# Patient Record
Sex: Female | Born: 1990 | Race: White | Hispanic: No | Marital: Married | Smoking: Former smoker
Health system: Southern US, Community
[De-identification: ages and names within clinical notes are randomized; demographics above are authoritative.]

## PROBLEM LIST (undated history)

## (undated) DIAGNOSIS — D649 Anemia, unspecified: Secondary | ICD-10-CM

## (undated) DIAGNOSIS — O26893 Other specified pregnancy related conditions, third trimester: Secondary | ICD-10-CM

## (undated) DIAGNOSIS — E079 Disorder of thyroid, unspecified: Secondary | ICD-10-CM

## (undated) DIAGNOSIS — Z6791 Unspecified blood type, Rh negative: Secondary | ICD-10-CM

## (undated) DIAGNOSIS — G8929 Other chronic pain: Secondary | ICD-10-CM

## (undated) DIAGNOSIS — O039 Complete or unspecified spontaneous abortion without complication: Secondary | ICD-10-CM

## (undated) DIAGNOSIS — R51 Headache: Secondary | ICD-10-CM

## (undated) HISTORY — DX: Headache: R51

## (undated) HISTORY — DX: Other chronic pain: G89.29

---
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
## 2011-10-27 ENCOUNTER — Ambulatory Visit (INDEPENDENT_AMBULATORY_CARE_PROVIDER_SITE_OTHER): Payer: BC Managed Care – PPO | Admitting: Women's Health

## 2011-10-27 ENCOUNTER — Encounter: Payer: Self-pay | Admitting: Women's Health

## 2011-10-27 VITALS — BP 120/70 | Ht 64.5 in | Wt 151.0 lb

## 2011-10-27 DIAGNOSIS — Z309 Encounter for contraceptive management, unspecified: Secondary | ICD-10-CM

## 2011-10-27 DIAGNOSIS — Z23 Encounter for immunization: Secondary | ICD-10-CM

## 2011-10-27 DIAGNOSIS — Z01419 Encounter for gynecological examination (general) (routine) without abnormal findings: Secondary | ICD-10-CM

## 2011-10-27 DIAGNOSIS — N926 Irregular menstruation, unspecified: Secondary | ICD-10-CM

## 2011-10-27 DIAGNOSIS — IMO0001 Reserved for inherently not codable concepts without codable children: Secondary | ICD-10-CM

## 2011-10-27 LAB — CBC WITH DIFFERENTIAL/PLATELET
Basophils Absolute: 0 10*3/uL (ref 0.0–0.1)
HCT: 38.5 % (ref 36.0–46.0)
Hemoglobin: 12.2 g/dL (ref 12.0–15.0)
Lymphocytes Relative: 35 % (ref 12–46)
Monocytes Absolute: 0.6 10*3/uL (ref 0.1–1.0)
Monocytes Relative: 9 % (ref 3–12)
Neutro Abs: 3.8 10*3/uL (ref 1.7–7.7)
RBC: 4.45 MIL/uL (ref 3.87–5.11)
RDW: 12.9 % (ref 11.5–15.5)
WBC: 6.8 10*3/uL (ref 4.0–10.5)

## 2011-10-27 LAB — PROLACTIN: Prolactin: 5.2 ng/mL

## 2011-10-27 LAB — URINALYSIS W MICROSCOPIC + REFLEX CULTURE
Bilirubin Urine: NEGATIVE
Specific Gravity, Urine: 1.015 (ref 1.005–1.030)
Urobilinogen, UA: 0.2 mg/dL (ref 0.0–1.0)

## 2011-10-27 LAB — TSH: TSH: 1.313 u[IU]/mL (ref 0.350–4.500)

## 2011-10-27 MED ORDER — NORGESTIMATE-ETH ESTRADIOL 0.25-35 MG-MCG PO TABS: 1.0000 | ORAL_TABLET | Freq: Every day | ORAL | Status: DC

## 2011-10-27 NOTE — Patient Instructions (Signed)
Health Maintenance, 18- to 21-Year-Old SCHOOL PERFORMANCE After high school completion, the Nancy Grimes adult may be attending college, technical or vocational school, or entering the military or the work force. SOCIAL AND EMOTIONAL DEVELOPMENT The Nancy Grimes adult establishes adult relationships and explores sexual identity. Nancy Grimes adults may be living at home or in a college dorm or apartment. Increasing independence is important with Nancy Grimes adults. Throughout adolescence, teens should assume responsibility of their own health care. IMMUNIZATIONS Most Nancy Grimes adults should be fully vaccinated. A booster dose of Tdap (tetanus, diphtheria, and pertussis, or "whooping cough"), a dose of meningococcal vaccine to protect against a certain type of bacterial meningitis, hepatitis A, human papillomarvirus (HPV), chickenpox, or measles vaccines may be indicated, if not given at an earlier age. Annual influenza or "flu" vaccination should be considered during flu season.  TESTING Annual screening for vision and hearing problems is recommended. Vision should be screened objectively at least once between 18 and 21 years of age. The Nancy Grimes adult may be screened for anemia or tuberculosis. Nancy Grimes adults should have a blood test to check for high cholesterol during this time period. Nancy Grimes adults should be screened for use of alcohol and drugs. If the Nancy Grimes adult is sexually active, screening for sexually transmitted infections, pregnancy, or HIV may be performed. Screening for cervical cancer should be performed within 3 years of beginning sexual activity. NUTRITION AND ORAL HEALTH  Adequate calcium intake is important. Consume 3 servings of low-fat milk and dairy products daily. For those who do not drink milk or consume dairy products, calcium enriched foods, such as juice, bread, or cereal, dark, leafy greens, or canned fish are alternate sources of calcium.   Drink plenty of water. Limit fruit juice to 8 to 12 ounces per day.  Avoid sugary beverages or sodas.   Discourage skipping meals, especially breakfast. Teens should eat a good variety of vegetables and fruits, as well as lean meats.   Avoid high fat, high salt, and high sugar foods, such as candy, chips, and cookies.   Encourage Nancy Grimes adults to participate in meal planning and preparation.   Eat meals together as a family whenever possible. Encourage conversation at mealtime.   Limit fast food choices and eating out at restaurants.   Brush teeth twice a day and floss.   Schedule dental exams twice a year.  SLEEP Regular sleep habits are important. PHYSICAL, SOCIAL, AND EMOTIONAL DEVELOPMENT  One hour of regular physical activity daily is recommended. Continue to participate in sports.   Encourage Nancy Grimes adults to develop their own interests and consider community service or volunteerism.   Provide guidance to the Nancy Grimes adult in making decisions about college and work plans.   Make sure that Nancy Grimes adults know that they should never be in a situation that makes them uncomfortable, and they should tell partners if they do not want to engage in sexual activity.   Talk to the Nancy Grimes adult about body image. Eating disorders may be noted at this time. Nancy Grimes adults may also be concerned about being overweight. Monitor the Nancy Grimes adult for weight gain or loss.   Mood disturbances, depression, anxiety, alcoholism, or attention problems may be noted in Nancy Grimes adults. Talk to the caregiver if there are concerns about mental illness.   Negotiate limit setting and independent decision making.   Encourage the Nancy Grimes adult to handle conflict without physical violence.   Avoid loud noises which may impair hearing.   Limit television and computer time to 2 hours per day.   Individuals who engage in excessive sedentary activity are more likely to become overweight.  RISK BEHAVIORS  Sexually active Nancy Grimes adults need to take precautions against pregnancy and sexually  transmitted infections. Talk to Nancy Grimes adults about contraception.   Provide a tobacco-free and drug-free environment for the Nancy Grimes adult. Talk to the Nancy Grimes adult about drug, tobacco, and alcohol use among friends or at friends' homes. Make sure the Nancy Grimes adult knows that smoking tobacco or marijuana and taking drugs have health consequences and may impact brain development.   Teach the Nancy Grimes adult about appropriate use of over-the-counter or prescription medicines.   Establish guidelines for driving and for Grimes with friends.   Talk to Nancy Grimes adults about the risks of drinking and driving or boating. Encourage the Nancy Grimes adult to call you if he or she or friends have been drinking or using drugs.   Remind Nancy Grimes adults to wear seat belts at all times in cars and life vests in boats.   Nancy Grimes adults should always wear a properly fitted helmet when they are Grimes a bicycle.   Use caution with all-terrain vehicles (ATVs) or other motorized vehicles.   Do not keep handguns in the home. (If you do, the gun and ammunition should be locked separately and out of the Nancy Grimes adult's access.)   Equip your home with smoke detectors and change the batteries regularly. Make sure all family members know the fire escape plans for your home.   Teach Nancy Grimes adults not to swim alone and not to dive in shallow water.   All individuals should wear sunscreen that protects against UVA and UVB light with at least a sun protection factor (SPF) of 30 when out in the sun. This minimizes sun burning.  WHAT'S NEXT? Nancy Grimes adults should visit their pediatrician or family physician yearly. By Nancy Grimes adulthood, health care should be transitioned to a family physician or internal medicine specialist. Sexually active females may want to begin annual physical exams with a gynecologist. Document Released: 10/23/2006 Document Revised: 07/17/2011 Document Reviewed: 11/12/2006 ExitCare Patient Information 2012 [employer]. 

## 2011-10-27 NOTE — Progress Notes (Signed)
Nancy Grimes 1991-01-03 [id number]    History:    The patient presents for annual exam.  Had SAB 08/29/11 at [facility]. States bled for approximately 10 days after SAB. Had several days of spotting, normal cycle in February for 7 days, currently on cycle now. Uses condoms for contraception. Had 2 gardasil's in the series approximately 7 months ago. States had positive Chlamydia that was treated in January, partner treated also. Partner presents today also had HIV, hepatitis and an RPR that were negative also. Requesting contraception that will help cycles be lighter with less cramps.   Past medical history, past surgical history, family history and social history were all reviewed and documented in the EPIC chart. Student at [employer].   ROS:  A  ROS was performed and pertinent positives and negatives are included in the history.  Exam:  Filed Vitals:   10/27/11 1119  BP: 120/70    General appearance:  Normal Head/Neck:  Normal, without cervical or supraclavicular adenopathy. Thyroid:  Symmetrical, normal in size, without palpable masses or nodularity. Respiratory  Effort:  Normal  Auscultation:  Clear without wheezing or rhonchi Cardiovascular  Auscultation:  Regular rate, without rubs, murmurs or gallops  Edema/varicosities:  Not grossly evident Abdominal  Soft,nontender, without masses, guarding or rebound.  Liver/spleen:  No organomegaly noted  Hernia:  None appreciated  Skin  Inspection:  Grossly normal  Palpation:  Grossly normal Neurologic/psychiatric  Orientation:  Normal with appropriate conversation.  Mood/affect:  Normal  Genitourinary    Breasts: Examined lying and sitting.     Right: Without masses, retractions, discharge or axillary adenopathy.     Left: Without masses, retractions, discharge or axillary adenopathy.   Inguinal/mons:  Normal without inguinal adenopathy  External genitalia:  Normal  BUS/Urethra/Skene's glands:   Normal  Bladder:  Normal  Vagina:  Normal  Cervix:  Normal/scant menses, GC/chlamydia culture taken  Uterus:   normal in size, shape and contour.  Midline and mobile/nontender  Adnexa/parametria:     Rt: Without masses or tenderness.   Lt: Without masses or tenderness.  Anus and perineum: Normal  Digital rectal exam:   Assessment/Plan:  21 y.o. SWF for annual exam for contraception management.  Contraception management STD screening/TOC Smoker, less than 5 cigarettes per day  Plan: Contraception options reviewed will try birth control pills. Will start today, prescription for Sprintec one by mouth daily, slight risk for blood clots and strokes reviewed. UPT negative. Reviewed importance of condoms first month and for infection control. Reviewed importance of no smoking. SBE's, exercise, calcium rich diet, MVI daily encouraged. Campus safety reviewed. Third gardasil given to complete the series. CBC, UA, GC/Chlamydia. Declines need for repeat HIV, hepatitis or RPR.     Harrington Challenger [facility], 1:31 PM 10/27/2011

## 2011-10-28 LAB — GC/CHLAMYDIA PROBE AMP, GENITAL: GC Probe Amp, Genital: NEGATIVE

## 2011-11-27 ENCOUNTER — Other Ambulatory Visit: Payer: Self-pay | Admitting: *Deleted

## 2011-11-27 DIAGNOSIS — IMO0001 Reserved for inherently not codable concepts without codable children: Secondary | ICD-10-CM

## 2011-11-27 MED ORDER — NORGESTIMATE-ETH ESTRADIOL 0.25-35 MG-MCG PO TABS: 1.0000 | ORAL_TABLET | Freq: Every day | ORAL | Status: DC

## 2011-11-27 NOTE — Telephone Encounter (Signed)
Pt called requesting refill for sprintec, rx sent to pharmacy.

## 2012-12-26 ENCOUNTER — Inpatient Hospital Stay: Payer: Self-pay | Admitting: Obstetrics and Gynecology

## 2012-12-26 LAB — CBC WITH DIFFERENTIAL/PLATELET
Basophil #: 0 10*3/uL (ref 0.0–0.1)
HCT: 32.2 % — ABNORMAL LOW (ref 35.0–47.0)
HGB: 10.5 g/dL — ABNORMAL LOW (ref 12.0–16.0)
Lymphocyte %: 6.6 %
MCH: 26.8 pg (ref 26.0–34.0)
MCHC: 32.6 g/dL (ref 32.0–36.0)
MCV: 82 fL (ref 80–100)
Platelet: 280 10*3/uL (ref 150–440)

## 2012-12-26 LAB — GC/CHLAMYDIA PROBE AMP

## 2012-12-28 LAB — HEMATOCRIT: HCT: 24.8 % — ABNORMAL LOW (ref 35.0–47.0)

## 2014-06-12 ENCOUNTER — Encounter: Payer: Self-pay | Admitting: Women's Health

## 2014-08-11 NOTE — L&D Delivery Note (Signed)
Delivery Note At  a viable female was delivered via  (Presentation loa: ;  ).Double loose nuchal reduced   APGAR:8/9 , ; weight  .   Placenta status:intact  , .  with the following complications:none  .    Anesthesia: Epidural  Episiotomy:  none Lacerations:  none Suture Repair: none Est. Blood Loss (mL):  200 cc  Mom to postpartum.  Baby to Couplet care / Skin to Skin.  SCHERMERHORN,THOMAS 05/08/2015, 4:50 PM

## 2014-12-19 NOTE — H&P (Signed)
L&D Evaluation:  History Expanded:  HPI 24 yo G2P0010 at 588w2d gestational age by LMP consistent with 22 week ultrasound presents with regular uterine contractions since 1130pm last night.  Her pregnancy complicated by late entry to care (23 weeks), Rh negative status, LSIL pap (<23 yo), She notes positive fetal movement, denies leakage of fluid and vaginal bleeding.   HBsAg neg / falied 1 hour gtt (3hour all wnl)   Blood Type (Maternal) A negative   Group B Strep Results Maternal (Result >5wks must be treated as unknown) positive   Maternal HIV Negative   Maternal Syphilis Ab Nonreactive   Maternal Varicella Immune   Rubella Results (Maternal) immune   [facility]   Patient's Medical History No Chronic Illness   Patient's Surgical History Arm surgery   Medications Pre Natal Vitamins   Allergies PCN   Social History none   Family History Non-Contributory   ROS:  ROS All systems were reviewed.  HEENT, CNS, GI, GU, Respiratory, CV, Renal and Musculoskeletal systems were found to be normal.   Exam:  Vital Signs stable   General no apparent distress, mild distress with contractions   Mental Status clear   Chest clear   Heart normal sinus rhythm   Abdomen gravid, tender with contractions   Estimated Fetal Weight Average for gestational age   Back no CVAT   Edema no edema   Pelvic no external lesions, 4/100/-1 (bulging bag)   Mebranes Intact   FHT Description 135/mod var/+accels/no decels   Ucx regular, 4-5 q 10 min avgd over 30 min   Skin no lesions   Impression:  Impression active labor   Plan:  Plan EFM/NST, monitor contractions and for cervical change, antibiotics for GBBS prophylaxis, fluids   Comments Admit for labor. Patient has changed from 2cm to 4cm since arriving this morning.  She has been in considerable pain since arriving with regular contractions GBS +: Ancef  Fetal well being: Cat 1 desires epidural: may receive when OK from  anesthesia standpoint   Electronic Signatures: Conard NovakJackson, Dent Plantz D (MD)  (Signed 18-May-14 12:37)  Authored: L&D Evaluation   Last Updated: 18-May-14 12:37 by Conard NovakJackson, Zorianna Taliaferro D (MD)

## 2015-05-03 LAB — OB RESULTS CONSOLE ABO/RH: RH TYPE: NEGATIVE

## 2015-05-03 LAB — OB RESULTS CONSOLE RUBELLA ANTIBODY, IGM: Rubella: NON-IMMUNE/NOT IMMUNE

## 2015-05-03 LAB — OB RESULTS CONSOLE GC/CHLAMYDIA
Chlamydia: NEGATIVE
Gonorrhea: NEGATIVE

## 2015-05-03 LAB — OB RESULTS CONSOLE RPR: RPR: NONREACTIVE

## 2015-05-03 LAB — OB RESULTS CONSOLE HIV ANTIBODY (ROUTINE TESTING): HIV: NONREACTIVE

## 2015-05-03 LAB — OB RESULTS CONSOLE HEPATITIS B SURFACE ANTIGEN: HEP B S AG: NEGATIVE

## 2015-05-03 LAB — OB RESULTS CONSOLE VARICELLA ZOSTER ANTIBODY, IGG: Varicella: IMMUNE

## 2015-05-03 LAB — OB RESULTS CONSOLE GBS: STREP GROUP B AG: POSITIVE

## 2015-05-03 LAB — OB RESULTS CONSOLE ANTIBODY SCREEN: ANTIBODY SCREEN: NEGATIVE

## 2015-05-07 DIAGNOSIS — Z6791 Unspecified blood type, Rh negative: Secondary | ICD-10-CM | POA: Insufficient documentation

## 2015-05-07 DIAGNOSIS — O9989 Other specified diseases and conditions complicating pregnancy, childbirth and the puerperium: Secondary | ICD-10-CM

## 2015-05-07 DIAGNOSIS — Z283 Underimmunization status: Secondary | ICD-10-CM | POA: Insufficient documentation

## 2015-05-07 DIAGNOSIS — O09899 Supervision of other high risk pregnancies, unspecified trimester: Secondary | ICD-10-CM | POA: Insufficient documentation

## 2015-05-07 DIAGNOSIS — O26893 Other specified pregnancy related conditions, third trimester: Secondary | ICD-10-CM

## 2015-05-07 DIAGNOSIS — B951 Streptococcus, group B, as the cause of diseases classified elsewhere: Secondary | ICD-10-CM | POA: Insufficient documentation

## 2015-05-08 ENCOUNTER — Inpatient Hospital Stay
Admission: EM | Admit: 2015-05-08 | Discharge: 2015-05-10 | DRG: 775 | Disposition: A | Discharge status: Home / Self Care | Payer: Commercial Managed Care - HMO | Attending: Obstetrics and Gynecology | Admitting: Obstetrics and Gynecology

## 2015-05-08 ENCOUNTER — Inpatient Hospital Stay: Payer: Commercial Managed Care - HMO | Admitting: Registered Nurse

## 2015-05-08 DIAGNOSIS — Z87891 Personal history of nicotine dependence: Secondary | ICD-10-CM | POA: Diagnosis not present

## 2015-05-08 DIAGNOSIS — Z880 Allergy status to penicillin: Secondary | ICD-10-CM

## 2015-05-08 DIAGNOSIS — Z22330 Carrier of Group B streptococcus: Secondary | ICD-10-CM | POA: Diagnosis not present

## 2015-05-08 DIAGNOSIS — Z3A39 39 weeks gestation of pregnancy: Secondary | ICD-10-CM | POA: Diagnosis present

## 2015-05-08 DIAGNOSIS — O26893 Other specified pregnancy related conditions, third trimester: Secondary | ICD-10-CM | POA: Diagnosis present

## 2015-05-08 DIAGNOSIS — O99824 Streptococcus B carrier state complicating childbirth: Principal | ICD-10-CM | POA: Diagnosis present

## 2015-05-08 LAB — CBC
HEMATOCRIT: 29.6 % — AB (ref 35.0–47.0)
HEMOGLOBIN: 10.2 g/dL — AB (ref 12.0–16.0)
MCH: 27.7 pg (ref 26.0–34.0)
MCHC: 34.3 g/dL (ref 32.0–36.0)
MCV: 80.9 fL (ref 80.0–100.0)
Platelets: 257 10*3/uL (ref 150–440)
RBC: 3.66 MIL/uL — AB (ref 3.80–5.20)
RDW: 16.3 % — ABNORMAL HIGH (ref 11.5–14.5)
WBC: 10.5 10*3/uL (ref 3.6–11.0)

## 2015-05-08 LAB — TYPE AND SCREEN
ABO/RH(D): A NEG
ANTIBODY SCREEN: POSITIVE

## 2015-05-08 MED ORDER — ONDANSETRON HCL 4 MG/2ML IJ SOLN: 4.0000 mg | Freq: Four times a day (QID) | INTRAMUSCULAR | Status: DC | PRN

## 2015-05-08 MED ORDER — BENZOCAINE-MENTHOL 20-0.5 % EX AERO
1.0000 "application " | INHALATION_SPRAY | CUTANEOUS | Status: DC | PRN
  Administered 2015-05-10: 1 via TOPICAL

## 2015-05-08 MED ORDER — BUTORPHANOL TARTRATE 1 MG/ML IJ SOLN: 1.0000 mg | INTRAMUSCULAR | Status: DC | PRN

## 2015-05-08 MED ORDER — TERBUTALINE SULFATE 1 MG/ML IJ SOLN: 0.2500 mg | Freq: Once | INTRAMUSCULAR | Status: DC | PRN

## 2015-05-08 MED ORDER — LACTATED RINGERS IV SOLN
INTRAVENOUS | Status: DC
  Administered 2015-05-08: 07:00:00 via INTRAVENOUS

## 2015-05-08 MED ORDER — SENNOSIDES-DOCUSATE SODIUM 8.6-50 MG PO TABS
2.0000 | ORAL_TABLET | ORAL | Status: DC
  Administered 2015-05-09: 2 via ORAL
  Filled 2015-05-08: qty 2

## 2015-05-08 MED ORDER — DIBUCAINE 1 % RE OINT: 1.0000 "application " | TOPICAL_OINTMENT | RECTAL | Status: DC | PRN

## 2015-05-08 MED ORDER — CEFAZOLIN SODIUM-DEXTROSE 2-3 GM-% IV SOLR
2.0000 g | Freq: Once | INTRAVENOUS | Status: AC
  Administered 2015-05-08: 2 g via INTRAVENOUS

## 2015-05-08 MED ORDER — OXYTOCIN 40 UNITS IN LACTATED RINGERS INFUSION - SIMPLE MED
62.5000 mL/h | INTRAVENOUS | Status: DC
  Filled 2015-05-08: qty 1000

## 2015-05-08 MED ORDER — OXYTOCIN 40 UNITS IN LACTATED RINGERS INFUSION - SIMPLE MED
1.0000 m[IU]/min | INTRAVENOUS | Status: DC
  Administered 2015-05-08: 1 m[IU]/min via INTRAVENOUS

## 2015-05-08 MED ORDER — MISOPROSTOL 200 MCG PO TABS
ORAL_TABLET | ORAL | Status: DC
Start: 2015-05-08 — End: 2015-05-08
  Filled 2015-05-08: qty 4

## 2015-05-08 MED ORDER — LIDOCAINE HCL (PF) 1 % IJ SOLN
INTRAMUSCULAR | Status: AC
  Filled 2015-05-08: qty 30

## 2015-05-08 MED ORDER — OXYCODONE-ACETAMINOPHEN 5-325 MG PO TABS: 1.0000 | ORAL_TABLET | ORAL | Status: DC | PRN

## 2015-05-08 MED ORDER — MAGNESIUM HYDROXIDE 400 MG/5ML PO SUSP: 30.0000 mL | ORAL | Status: DC | PRN

## 2015-05-08 MED ORDER — FERROUS SULFATE 325 (65 FE) MG PO TABS
325.0000 mg | ORAL_TABLET | Freq: Two times a day (BID) | ORAL | Status: DC
  Administered 2015-05-09 – 2015-05-10 (×2): 325 mg via ORAL
  Filled 2015-05-08 (×2): qty 1

## 2015-05-08 MED ORDER — CEFAZOLIN SODIUM 1-5 GM-% IV SOLN
INTRAVENOUS | Status: AC
  Administered 2015-05-08: 1000 mg
  Filled 2015-05-08: qty 50

## 2015-05-08 MED ORDER — IBUPROFEN 600 MG PO TABS
600.0000 mg | ORAL_TABLET | Freq: Four times a day (QID) | ORAL | Status: DC
  Administered 2015-05-09 (×3): 600 mg via ORAL
  Filled 2015-05-08 (×3): qty 1

## 2015-05-08 MED ORDER — ACETAMINOPHEN 325 MG PO TABS: 650.0000 mg | ORAL_TABLET | ORAL | Status: DC | PRN

## 2015-05-08 MED ORDER — ONDANSETRON HCL 4 MG/2ML IJ SOLN: 4.0000 mg | INTRAMUSCULAR | Status: DC | PRN

## 2015-05-08 MED ORDER — ONDANSETRON HCL 4 MG PO TABS
4.0000 mg | ORAL_TABLET | ORAL | Status: DC | PRN
Start: 2015-05-08 — End: 2015-05-10

## 2015-05-08 MED ORDER — LIDOCAINE-EPINEPHRINE (PF) 1.5 %-1:200000 IJ SOLN
INTRAMUSCULAR | Status: DC | PRN
  Administered 2015-05-08: 3 mL via PERINEURAL

## 2015-05-08 MED ORDER — WITCH HAZEL-GLYCERIN EX PADS: 1.0000 "application " | MEDICATED_PAD | CUTANEOUS | Status: DC | PRN

## 2015-05-08 MED ORDER — AMMONIA AROMATIC IN INHA
RESPIRATORY_TRACT | Status: AC
  Filled 2015-05-08: qty 10

## 2015-05-08 MED ORDER — LACTATED RINGERS IV SOLN
500.0000 mL | INTRAVENOUS | Status: DC | PRN
  Administered 2015-05-08: 500 mL via INTRAVENOUS

## 2015-05-08 MED ORDER — BUPIVACAINE HCL (PF) 0.25 % IJ SOLN
INTRAMUSCULAR | Status: DC | PRN
  Administered 2015-05-08 (×2): 4 mL via EPIDURAL

## 2015-05-08 MED ORDER — PRENATAL MULTIVITAMIN CH
1.0000 | ORAL_TABLET | Freq: Every day | ORAL | Status: DC
  Administered 2015-05-09: 09:00:00 via ORAL
  Administered 2015-05-10: 1 via ORAL
  Filled 2015-05-08 (×2): qty 1

## 2015-05-08 MED ORDER — LANOLIN HYDROUS EX OINT: TOPICAL_OINTMENT | CUTANEOUS | Status: DC | PRN

## 2015-05-08 MED ORDER — OXYCODONE-ACETAMINOPHEN 5-325 MG PO TABS
2.0000 | ORAL_TABLET | ORAL | Status: DC | PRN
Start: 2015-05-08 — End: 2015-05-08

## 2015-05-08 MED ORDER — OXYTOCIN 40 UNITS IN LACTATED RINGERS INFUSION - SIMPLE MED
62.5000 mL/h | INTRAVENOUS | Status: DC | PRN
  Administered 2015-05-09: 62.5 mL/h via INTRAVENOUS
  Filled 2015-05-08: qty 1000

## 2015-05-08 MED ORDER — SIMETHICONE 80 MG PO CHEW: 80.0000 mg | CHEWABLE_TABLET | ORAL | Status: DC | PRN

## 2015-05-08 MED ORDER — CEFAZOLIN SODIUM-DEXTROSE 2-3 GM-% IV SOLR
INTRAVENOUS | Status: AC
  Administered 2015-05-08: 2 g via INTRAVENOUS
  Filled 2015-05-08: qty 50

## 2015-05-08 MED ORDER — FENTANYL 2.5 MCG/ML W/ROPIVACAINE 0.2% IN NS 100 ML EPIDURAL INFUSION (ARMC-ANES)
EPIDURAL | Status: AC
  Administered 2015-05-08: 9 mL/h via EPIDURAL
  Filled 2015-05-08: qty 100

## 2015-05-08 MED ORDER — OXYTOCIN BOLUS FROM INFUSION
500.0000 mL | INTRAVENOUS | Status: DC
  Administered 2015-05-08: 500 mL via INTRAVENOUS

## 2015-05-08 MED ORDER — FENTANYL CITRATE (PF) 100 MCG/2ML IJ SOLN
INTRAMUSCULAR | Status: DC | PRN
  Administered 2015-05-08: 9 ug via INTRAVENOUS

## 2015-05-08 MED ORDER — ZOLPIDEM TARTRATE 5 MG PO TABS: 5.0000 mg | ORAL_TABLET | Freq: Every evening | ORAL | Status: DC | PRN

## 2015-05-08 MED ORDER — LIDOCAINE HCL (PF) 1 % IJ SOLN
30.0000 mL | INTRAMUSCULAR | Status: AC | PRN
  Administered 2015-05-08: 3 mL via SUBCUTANEOUS

## 2015-05-08 MED ORDER — MEASLES, MUMPS & RUBELLA VAC ~~LOC~~ INJ
0.5000 mL | INJECTION | Freq: Once | SUBCUTANEOUS | Status: AC
  Administered 2015-05-10: 0.5 mL via SUBCUTANEOUS
  Filled 2015-05-08: qty 0.5

## 2015-05-08 MED ORDER — CITRIC ACID-SODIUM CITRATE 334-500 MG/5ML PO SOLN: 30.0000 mL | ORAL | Status: DC | PRN

## 2015-05-08 MED ORDER — DIPHENHYDRAMINE HCL 25 MG PO CAPS: 25.0000 mg | ORAL_CAPSULE | Freq: Four times a day (QID) | ORAL | Status: DC | PRN

## 2015-05-08 NOTE — Progress Notes (Signed)
Dr Feliberto Gottron present in department. MD notified that pt passed out while on toilet after voiding. MD aware of interventions and fluid bolus. MD also aware of VS. MD ok with plan. No new orders received.

## 2015-05-08 NOTE — Progress Notes (Signed)
Pt transferred to room 343 via wheelchair in stable condition. Baby transferred with mother. SBAR report given to Carmon Ginsberg, RN

## 2015-05-08 NOTE — Anesthesia Procedure Notes (Addendum)
Epidural Patient location during procedure: OB Start time: 05/08/2015 10:48 AM End time: 05/08/2015 10:51 AM  Staffing Resident/CRNA: Stormy Fabian Performed by: resident/CRNA   Preanesthetic Checklist Completed: patient identified, site marked, surgical consent, pre-op evaluation, timeout performed, IV checked, risks and benefits discussed and monitors and equipment checked  Epidural Patient position: sitting Prep: Betadine Patient monitoring: heart rate, continuous pulse ox and blood pressure Approach: midline Location: L4-L5 Injection technique: LOR saline  Needle:  Needle type: Tuohy  Needle gauge: 18 G Needle length: 9 cm and 9 Needle insertion depth: 6 cm Catheter type: closed end flexible Catheter size: 20 Guage Catheter at skin depth: 11 cm Test dose: negative and 1.5% lidocaine with Epi 1:200 K  Assessment Events: blood not aspirated, injection not painful, no injection resistance, negative IV test and no paresthesia  Additional Notes   Patient tolerated the insertion well without complications.Reason for block:procedure for pain

## 2015-05-08 NOTE — H&P (Signed)
24 yo G2 P1001 [facility] with SROM at 0300 for clear fluid. Pt had 2 visits of PNC prior to arrival. Pt did not know last week that she was pregnant and came to see me for amenorrhea and bloating and was evaluated and found to be pregnant. Pt was shocked as she had taken 3 pregnancy tests and they were all neg. Pt was seen yest and had +GBS which was Clindamycin resistant. US done last week indicated pt to be 38 5/7 weeks and now 39 2/7 weeks with EDD of 05/13/2015.  History reviewed. No pertinent past medical history. Past Surgical History  Procedure Laterality Date  . Arm surgery  2007    RIGHT ELBOW   History reviewed. No pertinent family history.  Social History   Social History  . Marital Status: Single    Spouse Name: N/A  . Number of Children: N/A  . Years of Education: N/A   Occupational History  . Not on file.   Social History Main Topics  . Smoking status: Former Games developer  . Smokeless tobacco: Never Used  . Alcohol Use: No  . Drug Use: No  . Sexual Activity: Not Currently    Birth Control/ Protection: Condom   Other Topics Concern  . Not on file   Social History Narrative   Allergies  Allergen Reactions  . Other     SKIN ALLERGY-ACNE RX  . Penicillins Swelling  Gen: 24 yo white female in NAD. Heart: S1S2, RRR, no M/R/G. Lungs: CTA bilat, No W/R/R. Abd: Gravid, EFW 7#9oz. Cx: 4/80/vtx-2 Extrems: 1+/0 A: iup at term 2. GBS pos 3. PCN allergy with Clindamycin sensitivity P: Admit for delivery 2. Ancef  2 gms IV x 1 and then 1 gm IV q 8 hours 3. Continue to monitor UC/FHT's 4. Stadol 1-2 mg IV q 1-2 h prn pain

## 2015-05-08 NOTE — Progress Notes (Signed)
Patient ID: Nancy Grimes, female   DOB: [birth year], 24 y.o.   MRN: [id number] H+P reviewed . Assuming care . Prior variable decels ? Late . Current FHR reassuring . FSE+IUPC placed . Ancef dose at 0600 for GBS prophylaxis  If not adequate CTX pattern at noon will start pitocin augmentation

## 2015-05-08 NOTE — Anesthesia Preprocedure Evaluation (Addendum)
Anesthesia Evaluation  Patient identified by MRN, date of birth, ID band Patient awake    Reviewed: Allergy & Precautions, H&P , NPO status , Patient's Chart, lab work & pertinent test results  Airway Mallampati: II  TM Distance: <3 FB Neck ROM: full    Dental no notable dental hx.    Pulmonary neg pulmonary ROS, former smoker,    Pulmonary exam normal        Cardiovascular negative cardio ROS Normal cardiovascular exam     Neuro/Psych    GI/Hepatic negative GI ROS, Neg liver ROS,   Endo/Other  negative endocrine ROS  Renal/GU negative Renal ROS     Musculoskeletal   Abdominal   Peds  Hematology negative hematology ROS (+)   Anesthesia Other Findings   Reproductive/Obstetrics (+) Pregnancy                            Anesthesia Physical Anesthesia Plan  ASA: II  Anesthesia Plan: Epidural   Post-op Pain Management:    Induction:   Airway Management Planned:   Additional Equipment:   Intra-op Plan:   Post-operative Plan:   Informed Consent: I have reviewed the patients History and Physical, chart, labs and discussed the procedure including the risks, benefits and alternatives for the proposed anesthesia with the patient or authorized representative who has indicated his/her understanding and acceptance.     Plan Discussed with: Anesthesiologist  Anesthesia Plan Comments:         Anesthesia Quick Evaluation

## 2015-05-08 NOTE — Progress Notes (Signed)
Assisted pt to bathroom in stable condition. Pt voided. While doing pericare pt became dizzy and ultimately passed out on the toilet. Extra help called immediately. Ammonia used. Assisted pt back to bed on left sided trendelenberg. LR fluid bolus started. VS obtained.

## 2015-05-09 LAB — CBC
HCT: 26.9 % — ABNORMAL LOW (ref 35.0–47.0)
HEMOGLOBIN: 8.9 g/dL — AB (ref 12.0–16.0)
MCH: 27.2 pg (ref 26.0–34.0)
MCHC: 33.3 g/dL (ref 32.0–36.0)
MCV: 81.7 fL (ref 80.0–100.0)
PLATELETS: 231 10*3/uL (ref 150–440)
RBC: 3.29 MIL/uL — AB (ref 3.80–5.20)
RDW: 16.3 % — ABNORMAL HIGH (ref 11.5–14.5)
WBC: 13.2 10*3/uL — ABNORMAL HIGH (ref 3.6–11.0)

## 2015-05-09 LAB — RPR: RPR: NONREACTIVE

## 2015-05-09 MED ORDER — IBUPROFEN 600 MG PO TABS
600.0000 mg | ORAL_TABLET | Freq: Four times a day (QID) | ORAL | Status: DC | PRN
  Administered 2015-05-10: 600 mg via ORAL
  Filled 2015-05-09: qty 1

## 2015-05-09 NOTE — Progress Notes (Signed)
Post Partum Day 1 Subjective: no complaints  Objective: Blood pressure 100/55, pulse 77, temperature 97.6 F (36.4 C), temperature source Oral, resp. rate 18, height  (1.651 m), weight 86.183 kg (190 lb), SpO2 10 %, unknown if currently breastfeeding.  Physical Exam:  General: alert and cooperative Lochia: appropriate Uterine Fundus: firm Incision: n/a DVT Evaluation: No evidence of DVT seen on physical exam.   Recent Labs  05/08/15 0824 05/09/15 0612  HGB 10.2* 8.9*  HCT 29.6* 26.9*    Assessment/Plan: Plan for discharge tomorrow   LOS: 1 day   SCHERMERHORN,THOMAS 05/09/2015, 8:41 AM

## 2015-05-09 NOTE — Anesthesia Postprocedure Evaluation (Deleted)
  Anesthesia Post-op Note  Patient: Nancy Grimes  Procedure(s) Performed: * No procedures listed *  Anesthesia type:No value filed.  Patient location: 343  Post pain: Pain level controlled  Post assessment: Post-op Vital signs reviewed, Patient's Cardiovascular Status Stable, Respiratory Function Stable, Patent Airway and No signs of Nausea or vomiting  Post vital signs: Reviewed and stable  Last Vitals:  Filed Vitals:   05/09/15 0429  BP: 124/70  Pulse: 69  Temp: 36.7 C  Resp: 20    Level of consciousness: awake, alert  and patient cooperative  Complications: No apparent anesthesia complications

## 2015-05-09 NOTE — Anesthesia Postprocedure Evaluation (Signed)
  Anesthesia Post-op Note  Patient: Nancy Grimes  Procedure(s) Performed: * No procedures listed *  Anesthesia type:No value filed.  Patient location: 343  Post pain: Pain level controlled  Post assessment: Post-op Vital signs reviewed, Patient's Cardiovascular Status Stable, Respiratory Function Stable, Patent Airway and No signs of Nausea or vomiting  Post vital signs: Reviewed and stable  Last Vitals:  Filed Vitals:   05/09/15 0429  BP: 124/70  Pulse: 69  Temp: 36.7 C  Resp: 20    Level of consciousness: awake, alert  and patient cooperative  Complications: No apparent anesthesia complications  

## 2015-05-10 LAB — ABO/RH: ABO/RH(D): A NEG

## 2015-05-10 MED ORDER — IBUPROFEN 600 MG PO TABS: 600.0000 mg | ORAL_TABLET | Freq: Four times a day (QID) | ORAL | Status: DC | PRN

## 2015-05-10 MED ORDER — NORETHINDRONE 0.35 MG PO TABS: 1.0000 | ORAL_TABLET | Freq: Every day | ORAL | Status: DC

## 2015-05-10 MED ORDER — FERROUS SULFATE 325 (65 FE) MG PO TABS: 325.0000 mg | ORAL_TABLET | Freq: Two times a day (BID) | ORAL | Status: DC

## 2015-05-10 NOTE — Lactation Note (Signed)
This note was copied from the chart of Nancy Grimes. Lactation Consultation Note  Patient Name: Nancy Leilynn Pilat [id number] Date: 05/10/2015 Reason for consult: Initial assessment   Maternal Data Does the patient have breastfeeding experience prior to this delivery?: Yes Pt breastfed first child and had difficulty keeping baby latched to left breast, nipple is larger on this side and pt pumped left breast, this baby is having the same problem and pt is pumping right breast, for d/c today and will use manual pump until she can determine if she can get a breast pump through insurance, she does not want assistance with Lactation helping with latch on left breast, states baby latches well to right breast.  Feeding    LATCH Score/Interventions                      Lactation Tools Discussed/Used WIC Program: No   Consult Status Consult Status: Complete    Dyann Kief 05/10/2015, 3:14 PM

## 2015-05-10 NOTE — Progress Notes (Addendum)
PPD #2, SVD, baby boy  S:  Reports feeling good and ready to be discharged home             Tolerating po/ No nausea or vomiting             Bleeding is light             Pain controlled with Motrin             Up ad lib / ambulatory / voiding QS  Newborn breast feeding - going well   O:               VS: BP 125/57 mmHg  Pulse 86  Temp(Src) 98.3 F (36.8 C) (Oral)  Resp 18  Ht 5' 5"  (1.651 m)  Wt 86.183 kg (190 lb)  BMI 31.62 kg/m2  SpO2 99%  Breastfeeding? Unknown   LABS:              Recent Labs  05/08/15 0824 05/09/15 0612  WBC 10.5 13.2*  HGB 10.2* 8.9*  PLT 257 231               Blood type: --/--/A NEG (09/27 0825)  Rubella: Nonimmune (09/22 0000)                     I&O: Intake/Output      09/28 0701 - 09/29 0700 09/29 0701 - 09/30 0700   P.O. 360    I.V. (mL/kg)     IV Piggyback     Total Intake(mL/kg) 360 (4.2)    Urine (mL/kg/hr) 900 (0.4)    Blood     Total Output 900     Net -540                        Physical Exam:             Alert and oriented X3  Lungs: Clear and unlabored  Heart: regular rate and rhythm / no mumurs  Abdomen: soft, non-tender, non-distended              Fundus: firm, non-tender, U-1  Perineum: intact, no edema, no ecchymosis, no erythema  Lochia: scant, no clots  Extremities: non-pitting edema bilaterally LE, no calf pain or tenderness    A: PPD # 2  Rubella Non-Immune  Rh negative - baby Rh negative - no Rhogam indicated  Doing well - stable status  P: Routine post partum orders  MMR Vaccine prior to discharge  Birth Control Plan: POP  Discharge instructions reviewed  Discharge home today  Darliss Cheney, CNM

## 2015-05-10 NOTE — Discharge Summary (Signed)
VAGINAL DELIVERY DISCHARGE SUMMARY:  Patient ID: Nancy Grimes MRN: [id number] DOB/AGE: 1991-03-25 24 y.o.  Admit date: 05/08/2015 Admission Diagnoses: Active labor at Term with SROM at 0300   Discharge date:  05/10/15 Discharge Diagnoses: Postpartum Care following Vaginal Delivery   Prenatal history: [id number]   EDC : US done last week indicated pt to be 38 5/7 weeks and now 39 2/7 weeks with EDD of 05/13/2015, No LMP known   [facility]atal care at [facility] Primary provider : [facility] Prenatal course complicated by the patient did not know she was pregnant until visit with CNM and US showed her to be 38+5/7 weeks with only 2 prenatal care visits; GBS + Clindamycin resistant  Prenatal Labs: ABO, Rh: --/--/A NEG (09/27 0825) / Baby is also Rh Negative: No Rhogam indicated Antibody: POS (09/27 0824) Rubella: Nonimmune (09/22 0000)  / MMR booster given on discharge  RPR: Non Reactive (09/27 0824)  HBsAg: Negative (09/22 0000)  HIV: Non-reactive (09/22 0000)  GTT : N/A - pt. Late into prenatal care  GBS: Positive (09/22 0000)   Medical / Surgical History :  Past medical history: History reviewed. No pertinent past medical history.  Past surgical history:  Past Surgical History  Procedure Laterality Date  . Arm surgery  2007    RIGHT ELBOW    Family History: History reviewed. No pertinent family history.  Social History:  reports that she has quit smoking. She has never used smokeless tobacco. She reports that she does not drink alcohol or use illicit drugs.  Allergies: Other and Penicillins   Current Medications at time of admission:  Prior to Admission medications   Medication Sig Start Date End Date Taking? Authorizing Provider  ferrous sulfate 325 (65 FE) MG tablet Take 1 tablet (325 mg total) by mouth 2 (two) times daily with a meal. 05/10/15   Tyler Deis Sigmon, CNM  ibuprofen (ADVIL,MOTRIN) 600 MG tablet Take 1 tablet (600 mg total) by mouth every 6 (six) hours as  needed for mild pain. 05/10/15   Darliss Cheney, CNM  ibuprofen (ADVIL,MOTRIN) 800 MG tablet Take 800 mg by mouth every 8 (eight) hours as needed.    Historical Provider, MD  lisdexamfetamine (VYVANSE) 40 MG capsule Take 40 mg by mouth every morning.    Historical Provider, MD  norgestimate-ethinyl estradiol (ORTHO-CYCLEN,SPRINTEC,PREVIFEM) 0.25-35 MG-MCG tablet Take 1 tablet by mouth daily. 11/27/11 11/26/12  Huel Cote, NP    Intrapartum Course:  Admit for active labor SROM at 0300 clear fluid with labor progression to complete dilation  Pain management: Epidural  GBS + - Ancef given in labor  Uncomplicated Intrapartum and delivery course   Spontaneous Vaginal Delivery with delivery of viable female newborn by Dr. Ouida Sills   APGARS:  8, 9    Postpartum course:  Uncomplicated with discharge on PPD # 2   Discharge Instructions:  Discharged Condition: good  Activity: pelvic rest and postoperative restrictions x 2   Diet: routine and regular  Medications:    Medication List    STOP taking these medications        lisdexamfetamine 40 MG capsule  Commonly known as:  VYVANSE     norgestimate-ethinyl estradiol 0.25-35 MG-MCG tablet  Commonly known as:  ORTHO-CYCLEN,SPRINTEC,PREVIFEM      TAKE these medications        ferrous sulfate 325 (65 FE) MG tablet  Take 1 tablet (325 mg total) by mouth 2 (two) times daily with a meal.  ibuprofen 600 MG tablet  Commonly known as:  ADVIL,MOTRIN  Take 1 tablet (600 mg total) by mouth every 6 (six) hours as needed for mild pain.         Postpartum Instructions:  Discharge Instructions    Activity as tolerated    Complete by:  As directed      Call MD for:  difficulty breathing, headache or visual disturbances    Complete by:  As directed      Call MD for:  extreme fatigue    Complete by:  As directed      Call MD for:  hives    Complete by:  As directed      Call MD for:  persistant dizziness or light-headedness     Complete by:  As directed      Call MD for:  persistant nausea and vomiting    Complete by:  As directed      Call MD for:  redness, tenderness, or signs of infection (pain, swelling, redness, odor or green/yellow discharge around incision site)    Complete by:  As directed      Call MD for:  severe uncontrolled pain    Complete by:  As directed      Call MD for:  temperature >100.4    Complete by:  As directed      Call MD for:    Complete by:  As directed      Diet - low sodium heart healthy    Complete by:  As directed      Discharge instructions    Complete by:  As directed   Call if worsening symptoms / call if depression symptoms begin and unable to care for yourself or baby     Sexual acrtivity    Complete by:  As directed   No intercourse x 6 weeks          Discharge to: Home  Follow up :   [facility] in 6 weeks for routine postpartum visit   Birth Control: Micronor prescription given - begin at 3 week postpartum   Signed:  Darliss Cheney, CNM

## 2015-05-10 NOTE — Progress Notes (Signed)
Patient understands all discharge instructions and the need to make follow up appointments. Patient discharge via wheelchair with auxillary. 

## 2015-05-10 NOTE — Discharge Instructions (Signed)

## 2015-08-12 NOTE — L&D Delivery Note (Signed)
Obstetrical Delivery Note   Date of Delivery:   06/17/2016 Primary OB:   [facility] Gestational Age/EDD: 2748w1d (Dated by 35wk2d ultrasound) Antepartum complications: late entry to care  Delivered By:   Farrel ConnersGUTIERREZ, Izumi Mixon, CNM  Delivery Type:   spontaneous vaginal delivery  Procedure Details:   Patient presented with complaints of PROM at 1600 this evening. Was not contracting initially, and was begun on Kefzol for GBS PPX. Contractions began suddenly and became intense rapidly. An epidural was administered and she progressed rapidly delivering about an hour after her contractions intensified. There was a SVD of a vigorous female infant in OA with a loose nuchal cord that was reduced on the perineum. Baby was dried and placed on mother's abdomen. After delayed cord clamping, the FOB cut the cord. Baby began grunting and was taken to the nursery to transition. The IV was temporarily not working and she was given Pitocin 10 IU IM after the spontaneous delivery of an intact placenta and 3 vessel cord. The Pitocin infusion was begun when the IV resumed working at [employer]62.5cc/hr. Anesthesia:    epidural Intrapartum complications: PROM and precipitous labor GBS:    Positive-treated adequately Laceration:    none Episiotomy:    none Placenta:    Via active 3rd stage. To pathology: no Estimated Blood Loss:  450ml  Baby:    Liveborn female, Apgars 8/9, weight 8#12.4oz/ Castiel    Audrey Thull, CNM

## 2016-06-17 ENCOUNTER — Inpatient Hospital Stay
Admission: EM | Admit: 2016-06-17 | Discharge: 2016-06-19 | DRG: 775 | Disposition: A | Discharge status: Home / Self Care | Payer: Commercial Managed Care - HMO | Attending: Certified Nurse Midwife | Admitting: Certified Nurse Midwife

## 2016-06-17 ENCOUNTER — Inpatient Hospital Stay: Payer: Commercial Managed Care - HMO | Admitting: Anesthesiology

## 2016-06-17 ENCOUNTER — Encounter: Payer: Self-pay | Admitting: Certified Nurse Midwife

## 2016-06-17 DIAGNOSIS — Z3A41 41 weeks gestation of pregnancy: Secondary | ICD-10-CM

## 2016-06-17 DIAGNOSIS — Z6791 Unspecified blood type, Rh negative: Secondary | ICD-10-CM

## 2016-06-17 DIAGNOSIS — O4202 Full-term premature rupture of membranes, onset of labor within 24 hours of rupture: Secondary | ICD-10-CM | POA: Diagnosis present

## 2016-06-17 DIAGNOSIS — O99824 Streptococcus B carrier state complicating childbirth: Secondary | ICD-10-CM | POA: Diagnosis present

## 2016-06-17 DIAGNOSIS — Z87891 Personal history of nicotine dependence: Secondary | ICD-10-CM

## 2016-06-17 DIAGNOSIS — D62 Acute posthemorrhagic anemia: Secondary | ICD-10-CM | POA: Diagnosis not present

## 2016-06-17 DIAGNOSIS — O26893 Other specified pregnancy related conditions, third trimester: Secondary | ICD-10-CM | POA: Diagnosis present

## 2016-06-17 DIAGNOSIS — O9081 Anemia of the puerperium: Secondary | ICD-10-CM | POA: Diagnosis not present

## 2016-06-17 HISTORY — DX: Unspecified blood type, rh negative: Z67.91

## 2016-06-17 HISTORY — DX: Other specified pregnancy related conditions, third trimester: O26.893

## 2016-06-17 LAB — CBC
HEMATOCRIT: 30.3 % — AB (ref 35.0–47.0)
HEMOGLOBIN: 9.9 g/dL — AB (ref 12.0–16.0)
MCH: 26.1 pg (ref 26.0–34.0)
MCHC: 32.6 g/dL (ref 32.0–36.0)
MCV: 80 fL (ref 80.0–100.0)
Platelets: 220 10*3/uL (ref 150–440)
RBC: 3.78 MIL/uL — AB (ref 3.80–5.20)
RDW: 24.6 % — AB (ref 11.5–14.5)
WBC: 11.4 10*3/uL — AB (ref 3.6–11.0)

## 2016-06-17 MED ORDER — CEFAZOLIN IN D5W 1 GM/50ML IV SOLN
1.0000 g | Freq: Three times a day (TID) | INTRAVENOUS | Status: DC
  Filled 2016-06-17 (×2): qty 50

## 2016-06-17 MED ORDER — LIDOCAINE-EPINEPHRINE (PF) 1.5 %-1:200000 IJ SOLN
INTRAMUSCULAR | Status: DC | PRN
  Administered 2016-06-17: 4 mL via EPIDURAL

## 2016-06-17 MED ORDER — MISOPROSTOL 200 MCG PO TABS
ORAL_TABLET | ORAL | Status: AC
Start: 2016-06-17 — End: 2016-06-18
  Filled 2016-06-17: qty 4

## 2016-06-17 MED ORDER — FENTANYL 2.5 MCG/ML W/ROPIVACAINE 0.2% IN NS 100 ML EPIDURAL INFUSION (ARMC-ANES)
EPIDURAL | Status: DC | PRN
  Administered 2016-06-17: 10 mL/h via EPIDURAL

## 2016-06-17 MED ORDER — LIDOCAINE HCL (PF) 1 % IJ SOLN
INTRAMUSCULAR | Status: DC | PRN
  Administered 2016-06-17: 4 mL via SUBCUTANEOUS

## 2016-06-17 MED ORDER — LACTATED RINGERS IV SOLN
INTRAVENOUS | Status: DC
  Administered 2016-06-17: 125 mL/h via INTRAVENOUS

## 2016-06-17 MED ORDER — OXYTOCIN 40 UNITS IN LACTATED RINGERS INFUSION - SIMPLE MED
2.5000 [IU]/h | INTRAVENOUS | Status: DC
  Administered 2016-06-17: 2.5 [IU]/h via INTRAVENOUS

## 2016-06-17 MED ORDER — OXYTOCIN BOLUS FROM INFUSION: 500.0000 mL | Freq: Once | INTRAVENOUS | Status: DC

## 2016-06-17 MED ORDER — OXYTOCIN 40 UNITS IN LACTATED RINGERS INFUSION - SIMPLE MED
INTRAVENOUS | Status: AC
Start: 2016-06-17 — End: 2016-06-17
  Administered 2016-06-17: 2.5 [IU]/h via INTRAVENOUS
  Filled 2016-06-17: qty 1000

## 2016-06-17 MED ORDER — LACTATED RINGERS IV SOLN: 500.0000 mL | INTRAVENOUS | Status: DC | PRN

## 2016-06-17 MED ORDER — BUPIVACAINE HCL (PF) 0.25 % IJ SOLN
INTRAMUSCULAR | Status: DC | PRN
  Administered 2016-06-17: 5 mL via EPIDURAL

## 2016-06-17 MED ORDER — AMMONIA AROMATIC IN INHA
RESPIRATORY_TRACT | Status: AC
  Filled 2016-06-17: qty 10

## 2016-06-17 MED ORDER — TERBUTALINE SULFATE 1 MG/ML IJ SOLN
0.2500 mg | Freq: Once | INTRAMUSCULAR | Status: DC | PRN
  Filled 2016-06-17: qty 1

## 2016-06-17 MED ORDER — FENTANYL 2.5 MCG/ML W/ROPIVACAINE 0.2% IN NS 100 ML EPIDURAL INFUSION (ARMC-ANES)
EPIDURAL | Status: AC
  Filled 2016-06-17: qty 100

## 2016-06-17 MED ORDER — LIDOCAINE HCL (PF) 1 % IJ SOLN
INTRAMUSCULAR | Status: AC
  Filled 2016-06-17: qty 30

## 2016-06-17 MED ORDER — LIDOCAINE HCL (PF) 1 % IJ SOLN: 30.0000 mL | INTRAMUSCULAR | Status: DC | PRN

## 2016-06-17 MED ORDER — MISOPROSTOL 200 MCG PO TABS: 800.0000 ug | ORAL_TABLET | Freq: Once | ORAL | Status: DC | PRN

## 2016-06-17 MED ORDER — CEFAZOLIN SODIUM-DEXTROSE 2-4 GM/100ML-% IV SOLN
2.0000 g | Freq: Once | INTRAVENOUS | Status: AC
  Administered 2016-06-17: 2 g via INTRAVENOUS
  Filled 2016-06-17: qty 100

## 2016-06-17 MED ORDER — OXYTOCIN 10 UNIT/ML IJ SOLN: 10.0000 [IU] | Freq: Once | INTRAMUSCULAR | Status: DC

## 2016-06-17 MED ORDER — OXYTOCIN 10 UNIT/ML IJ SOLN
INTRAMUSCULAR | Status: AC
  Administered 2016-06-17: 10 [IU]
  Filled 2016-06-17: qty 2

## 2016-06-17 MED ORDER — AMMONIA AROMATIC IN INHA: 0.3000 mL | Freq: Once | RESPIRATORY_TRACT | Status: DC | PRN

## 2016-06-17 MED ORDER — ONDANSETRON HCL 4 MG/2ML IJ SOLN
4.0000 mg | Freq: Four times a day (QID) | INTRAMUSCULAR | Status: DC | PRN
  Administered 2016-06-17: 4 mg via INTRAVENOUS
  Filled 2016-06-17: qty 2

## 2016-06-17 MED ORDER — OXYTOCIN 40 UNITS IN LACTATED RINGERS INFUSION - SIMPLE MED
1.0000 m[IU]/min | INTRAVENOUS | Status: DC
  Filled 2016-06-17: qty 1000

## 2016-06-17 NOTE — OB Triage Note (Addendum)
Pt here with SROM at 1600 today.  positive for fetal movement, no vaginal bleeding noted. Clear fluid.

## 2016-06-17 NOTE — H&P (Signed)
OB History & Physical   History of Present Illness:  Chief Complaint:  "My bag of water started leaking at 4PM tonight. It looked clear." No bleeding. Contractions every 20 minutes HPI:  Nancy Grimes is a 25 y.o. [phone] female at 4118w1d dated by a 35wk2d ultrasound.  Her pregnancy has been complicated by close pregnancy spacing (last delivery last year.) and late prenatal care. Is RH negative, but did not receive Rhogam until 35 weeks due to late entry...  She presents to L&D for evaluation of SROM.   OB HX remarkable for two vaginal deliveries in 2014 and 2016. Pelvis proven to 7#10oz. [facility] . Will be breast and bottle feeding. No TDAP this pregnancy.       Maternal Medical History:   Past Medical History:  Diagnosis Date  . Rh negative state in antepartum period, third trimester     Past Surgical History:  Procedure Laterality Date  . ARM SURGERY  2007   RIGHT ELBOW    Allergies  Allergen Reactions  . Other     SKIN ALLERGY-ACNE RX  . Penicillins Swelling    Prior to Admission medications   Medication Sig Start Date End Date Taking? Authorizing Provider  ferrous sulfate 325 (65 FE) MG tablet Take 1 tablet (325 mg total) by mouth 2 (two) times daily with a meal. 05/10/15  Yes Meredith C Sigmon, CNM  Prenatal Vit-Fe Fumarate-FA (MULTIVITAMIN-PRENATAL) 27-0.8 MG TABS tablet Take 1 tablet by mouth daily at 12 noon.   Yes Historical Provider, MD          Social History: She  reports that she has quit smoking. She has never used smokeless tobacco. She reports that she does not drink alcohol or use drugs.  Family History: family history is not on file.   Review of Systems: Negative x 10 systems reviewed except as noted in the HPI.      Physical Exam:  Vital Signs: Temp 97.7 F (36.5 C) (Oral)   Resp 17   LMP 02/04/2016 , BP 125/75, pulse 110. General: no acute distress.  HEENT: normocephalic, atraumatic Heart: mild  tachycardia & regular rhythm.  No murmurs Lungs: clear to auscultation bilaterally Abdomen: soft, gravid, non-tender;  EFW: 7#10oz Pelvic:   External: Normal external female genitalia  Cervix: Dilation: 4.5 / Effacement (%): 60 / Station: -2    ROM: + pooling; + nitrazine; Extremities: non-tender, symmetric,no edema bilaterally.   Neurologic: Alert & oriented x 3.    Pertinent Results:  Prenatal Labs: Blood type/Rh A negative  Antibody screen negative  Rubella Varicella Immune immune  RPR Non reactive  HBsAg negative  HIV Non reactive  GC negative  Chlamydia negative  Genetic screening NA  1 hour GTT Not done  3 hour GTT NA  GBS positive on 10/6  FHR: 140s with accelerations to 160s to 170, one variable decel to 80 x 30sec, moderate variability Toco: occasional irregular contraction. Bedside Ultrasound: LOT/ anterior placenta    Assessment:  Nancy Grimes is a 25 y.o. [phone] female at 5518w1d with PROM x2.5 hours  Plan:  1. Admit to Labor & Delivery  2. CBC, T&S, Clrs, IVF 3. GBS positive-start Kefzol for PPX.   4. Consents obtained. 5. Epidural when active. If not active in 4 hours will start Pitocin  Altha Sweitzer  06/17/2016 6:17 PM

## 2016-06-17 NOTE — Anesthesia Procedure Notes (Signed)
Epidural Patient location during procedure: OB  Staffing Performed: anesthesiologist   Preanesthetic Checklist Completed: patient identified, site marked, surgical consent, pre-op evaluation, timeout performed, IV checked, risks and benefits discussed and monitors and equipment checked  Epidural Patient position: sitting Prep: Betadine Patient monitoring: heart rate, continuous pulse ox and blood pressure Approach: midline Location: L4-L5 Injection technique: LOR air  Needle:  Needle type: Tuohy  Needle gauge: 18 G Needle length: 9 cm and 9 Needle insertion depth: 8 cm Catheter type: closed end flexible Catheter size: 20 Guage Catheter at skin depth: 13 cm Test dose: negative and 1.5% lidocaine with Epi 1:200 K  Assessment Sensory level: T10 Events: blood not aspirated, injection not painful, no injection resistance, negative IV test and no paresthesia  Additional Notes Pt's history reviewed and consent obtained as per OB consent Patient tolerated the insertion well without complications. Negative SATD, negative IVTD All VSS were obtained and monitored through OBIX and nursing protocols followed.Reason for block:procedure for pain

## 2016-06-17 NOTE — Discharge Summary (Signed)
Physician Obstetric Discharge Summary  Patient ID: Nancy Grimes Garbutt MRN: [id number] DOB/AGE: May 16, 1991 25 y.o.   Date of Admission: 06/17/2016  Date of Discharge: 06/19/2016  Admitting Diagnosis: Premature rupture of membrane at 8180w1d  Secondary Diagnosis: Rh negative/ late entry to care/anemia  Mode of Delivery: normal spontaneous vaginal delivery 06/17/2016      Discharge Diagnosis: Term intrauterine pregnancy-delivered   Intrapartum Procedures: epidural and GBS prophylaxis   Post partum procedures: none  Complications: none   Brief Hospital Course  Nancy Grimes Semper is a [id number] who had a SVD on 06/17/2016;  for further details of this delivery, please refer to the delivery note.  Patient had an uncomplicated postpartum course.  By time of discharge on PPD#2, her pain was controlled on oral pain medications; she had appropriate lochia and was ambulating, voiding without difficulty and tolerating regular diet.  She was deemed stable for discharge to home.    Labs: CBC Latest Ref Rng & Units 06/18/2016 06/17/2016 05/09/2015  WBC 3.6 - 11.0 K/uL 15.8(H) 11.4(H) 13.2(H)  Hemoglobin 12.0 - 16.0 g/dL 6.6(Y9.1(L) 4.0(H9.9(L) 8.9(L)  Hematocrit 35.0 - 47.0 % 28.1(L) 30.3(L) 26.9(L)  Platelets 150 - 440 K/uL 192 220 231   A NEG/ RI/ VI Baby is also Rh negative. No Rhogam given  Physical exam:  Blood pressure 126/79, pulse 93, temperature 98.3 F (36.8 C), temperature source Oral, resp. rate 18, height 5\' 4"  (1.626 m), weight 89.8 kg (198 lb), last menstrual period 02/04/2016, SpO2 100 %, breastfeeding. General: alert and no distress Lochia: appropriate Abdomen: soft, NT Uterine Fundus: firm Extremities: No evidence of DVT seen on physical exam. No lower extremity edema.  Discharge Instructions: Per After Visit Summary. Activity: Advance as tolerated. Pelvic rest for 6 weeks.  Also refer to Discharge Instructions Diet: Regular Medications: resume prenatal vitamin and iron, Ibuprofen 600 mg  every 6 hours as needed for moderate pain TDAP given prior to discharge  Outpatient follow up:  Follow-up Information    GUTIERREZ, COLLEEN, CNM Follow up.   Specialty:  Certified Nurse Midwife Why:  call for a 6 week postpartum appt and IUD insertion Contact information: [address] [place] [phone]          Postpartum contraception: IUD  Discharged Condition: good  Discharged to: home  Newborn Data: Disposition:home with mother  Apgars: APGAR (1 MIN): 8   APGAR (5 MINS): 9   APGAR (10 MINS):   Weight: 3980g  Baby Feeding: Bottle and Breast  Beza Steppe, CNM 06/19/2016 9:50 AM

## 2016-06-17 NOTE — Anesthesia Preprocedure Evaluation (Signed)
Anesthesia Evaluation  Patient identified by MRN, date of birth, ID band Patient awake    Reviewed: Allergy & Precautions, H&P , NPO status , Patient's Chart, lab work & pertinent test results, reviewed documented beta blocker date and time   Airway Mallampati: II  TM Distance: >3 FB Neck ROM: full    Dental no notable dental hx. (+) Teeth Intact   Pulmonary neg pulmonary ROS, Current Smoker, former smoker,    Pulmonary exam normal breath sounds clear to auscultation       Cardiovascular Exercise Tolerance: Good negative cardio ROS   Rhythm:regular Rate:Normal     Neuro/Psych negative neurological ROS  negative psych ROS   GI/Hepatic negative GI ROS, Neg liver ROS,   Endo/Other  negative endocrine ROSdiabetes  Renal/GU      Musculoskeletal   Abdominal   Peds  Hematology negative hematology ROS (+)   Anesthesia Other Findings   Reproductive/Obstetrics (+) Pregnancy                             Anesthesia Physical Anesthesia Plan  ASA: II and emergent  Anesthesia Plan: Epidural   Post-op Pain Management:    Induction:   Airway Management Planned:   Additional Equipment:   Intra-op Plan:   Post-operative Plan:   Informed Consent:   Plan Discussed with:   Anesthesia Plan Comments:         Anesthesia Quick Evaluation

## 2016-06-18 LAB — CBC
HEMATOCRIT: 28.1 % — AB (ref 35.0–47.0)
Hemoglobin: 9.1 g/dL — ABNORMAL LOW (ref 12.0–16.0)
MCH: 26 pg (ref 26.0–34.0)
MCHC: 32.4 g/dL (ref 32.0–36.0)
MCV: 80 fL (ref 80.0–100.0)
PLATELETS: 192 10*3/uL (ref 150–440)
RBC: 3.51 MIL/uL — ABNORMAL LOW (ref 3.80–5.20)
RDW: 24.8 % — AB (ref 11.5–14.5)
WBC: 15.8 10*3/uL — ABNORMAL HIGH (ref 3.6–11.0)

## 2016-06-18 LAB — RPR: RPR Ser Ql: NONREACTIVE

## 2016-06-18 MED ORDER — OXYCODONE-ACETAMINOPHEN 5-325 MG PO TABS: 1.0000 | ORAL_TABLET | ORAL | Status: DC | PRN

## 2016-06-18 MED ORDER — FERROUS SULFATE 325 (65 FE) MG PO TABS
325.0000 mg | ORAL_TABLET | Freq: Every day | ORAL | Status: DC
  Administered 2016-06-18 – 2016-06-19 (×2): 325 mg via ORAL
  Filled 2016-06-18: qty 1

## 2016-06-18 MED ORDER — DIBUCAINE 1 % RE OINT: 1.0000 "application " | TOPICAL_OINTMENT | RECTAL | Status: DC | PRN

## 2016-06-18 MED ORDER — PRENATAL MULTIVITAMIN CH
1.0000 | ORAL_TABLET | Freq: Every day | ORAL | Status: DC
  Administered 2016-06-18 – 2016-06-19 (×2): 1 via ORAL
  Filled 2016-06-18 (×2): qty 1

## 2016-06-18 MED ORDER — ONDANSETRON HCL 4 MG PO TABS: 4.0000 mg | ORAL_TABLET | ORAL | Status: DC | PRN

## 2016-06-18 MED ORDER — TETANUS-DIPHTH-ACELL PERTUSSIS 5-2.5-18.5 LF-MCG/0.5 IM SUSP
0.5000 mL | Freq: Once | INTRAMUSCULAR | Status: DC
  Filled 2016-06-18: qty 0.5

## 2016-06-18 MED ORDER — IBUPROFEN 600 MG PO TABS
600.0000 mg | ORAL_TABLET | Freq: Four times a day (QID) | ORAL | Status: DC
  Administered 2016-06-18 – 2016-06-19 (×5): 600 mg via ORAL
  Filled 2016-06-18 (×5): qty 1

## 2016-06-18 MED ORDER — ONDANSETRON HCL 4 MG/2ML IJ SOLN: 4.0000 mg | INTRAMUSCULAR | Status: DC | PRN

## 2016-06-18 MED ORDER — BENZOCAINE-MENTHOL 20-0.5 % EX AERO: 1.0000 "application " | INHALATION_SPRAY | CUTANEOUS | Status: DC | PRN

## 2016-06-18 MED ORDER — SIMETHICONE 80 MG PO CHEW: 80.0000 mg | CHEWABLE_TABLET | ORAL | Status: DC | PRN

## 2016-06-18 MED ORDER — COCONUT OIL OIL
1.0000 "application " | TOPICAL_OIL | Status: DC | PRN
  Administered 2016-06-18: 1 via TOPICAL
  Filled 2016-06-18: qty 120

## 2016-06-18 MED ORDER — SENNOSIDES-DOCUSATE SODIUM 8.6-50 MG PO TABS
2.0000 | ORAL_TABLET | ORAL | Status: DC
  Administered 2016-06-18 – 2016-06-19 (×2): 2 via ORAL
  Filled 2016-06-18 (×2): qty 2

## 2016-06-18 MED ORDER — WITCH HAZEL-GLYCERIN EX PADS: 1.0000 "application " | MEDICATED_PAD | CUTANEOUS | Status: DC | PRN

## 2016-06-18 MED ORDER — OXYCODONE-ACETAMINOPHEN 5-325 MG PO TABS: 2.0000 | ORAL_TABLET | ORAL | Status: DC | PRN

## 2016-06-18 NOTE — Progress Notes (Signed)
  Subjective:  Doing well no concerns, moderate lochia  Objective:   Blood pressure 112/68, pulse 80, temperature 98.2 F (36.8 C), temperature source Oral, resp. rate 18, height 5\' 4"  (1.626 m), weight 198 lb (89.8 kg), last menstrual period 02/04/2016, SpO2 100 %, unknown if currently breastfeeding.  General: NAD Pulmonary: no increased work of breathing Abdomen: non-distended, non-tender, fundus firm at level of umbilicus Extremities: no edema, no erythema, no tenderness  Results for orders placed or performed during the hospital encounter of 06/17/16 (from the past 72 hour(s))  CBC     Status: Abnormal   Collection Time: 06/17/16  5:47 PM  Result Value Ref Range   WBC 11.4 (H) 3.6 - 11.0 K/uL   RBC 3.78 (L) 3.80 - 5.20 MIL/uL   Hemoglobin 9.9 (L) 12.0 - 16.0 g/dL   HCT 29.530.3 (L) 62.135.0 - 30.847.0 %   MCV 80.0 80.0 - 100.0 fL   MCH 26.1 26.0 - 34.0 pg   MCHC 32.6 32.0 - 36.0 g/dL   RDW 65.724.6 (H) 84.611.5 - 96.214.5 %   Platelets 220 150 - 440 K/uL  Type and screen [facility]     Status: None (Preliminary result)   Collection Time: 06/17/16  5:47 PM  Result Value Ref Range   ABO/RH(D) A NEG    Antibody Screen POS    Sample Expiration 06/20/2016    Antibody Identification PASSIVELY ACQUIRED ANTI-D    Unit Number [id number]    Blood Component Type RED CELLS,LR    Unit division 00    Status of Unit ALLOCATED    Transfusion Status OK TO TRANSFUSE    Crossmatch Result COMPATIBLE    Unit Number [id number]    Blood Component Type RED CELLS,LR    Unit division 00    Status of Unit ALLOCATED    Transfusion Status OK TO TRANSFUSE    Crossmatch Result COMPATIBLE   RPR     Status: None   Collection Time: 06/17/16  5:47 PM  Result Value Ref Range   RPR Ser Ql Non Reactive Non Reactive    Comment: (NOTE) Performed At: [facility] [address] [place] [id number] Mila HomerHancock William F MD FI:[id number]:[phone]   CBC     Status: Abnormal   Collection Time:  06/18/16  5:53 AM  Result Value Ref Range   WBC 15.8 (H) 3.6 - 11.0 K/uL   RBC 3.51 (L) 3.80 - 5.20 MIL/uL   Hemoglobin 9.1 (L) 12.0 - 16.0 g/dL   HCT 16.628.1 (L) 06.335.0 - 01.647.0 %   MCV 80.0 80.0 - 100.0 fL   MCH 26.0 26.0 - 34.0 pg   MCHC 32.4 32.0 - 36.0 g/dL   RDW 01.024.8 (H) 93.211.5 - 35.514.5 %   Platelets 192 150 - 440 K/uL   Information for the patient's newborn:  Zenda Alpersutnam, Boy Justice [[id number]][[id number]]  O NEG   Assessment:   25 y.o. [id number] postpartum day #1 TSVD  Plan:    1) Acute blood loss anemia - hemodynamically stable and asymptomatic - po ferrous sulfate  2) --/--/A NEG (11/07 1747) Infant O neg/ Rubella Immune / Varicella Immune  3)  Breast/IUD  5) Disposition anticipate discharge PPD2

## 2016-06-18 NOTE — Anesthesia Postprocedure Evaluation (Signed)
Anesthesia Post Note  Patient: Nancy Grimes  Procedure(s) Performed: * No procedures listed *  Patient location during evaluation: Mother Baby Anesthesia Type: Epidural Level of consciousness: awake, awake and alert and oriented Pain management: pain level controlled Vital Signs Assessment: post-procedure vital signs reviewed and stable Respiratory status: spontaneous breathing, nonlabored ventilation and respiratory function stable Cardiovascular status: stable Postop Assessment: no headache, no backache, patient able to bend at knees, no signs of nausea or vomiting and adequate PO intake Anesthetic complications: no    Last Vitals:  Vitals:   06/18/16 0255 06/18/16 0801  BP: (!) 103/52 112/68  Pulse: 70 80  Resp: 18 18  Temp: 36.8 C 36.8 C    Last Pain:  Vitals:   06/18/16 0801  TempSrc: Oral  PainSc:                  [name]

## 2016-06-19 LAB — TYPE AND SCREEN
ABO/RH(D): A NEG
Antibody Screen: POSITIVE
UNIT DIVISION: 0
Unit division: 0

## 2016-06-19 MED ORDER — TETANUS-DIPHTH-ACELL PERTUSSIS 5-2.5-18.5 LF-MCG/0.5 IM SUSP
0.5000 mL | Freq: Once | INTRAMUSCULAR | Status: AC
  Administered 2016-06-19: 0.5 mL via INTRAMUSCULAR

## 2016-06-19 NOTE — Discharge Instructions (Signed)
Please call your doctor or return to the ER if you experience any chest pains, shortness of breath, fever greater than 101, any heavy bleeding (saturating more than 1 pad per hour), large clots, or foul smelling discharge, any worsening abdominal pain and cramping that is not controlled by pain medication, or any signs of postpartum depression. No tampons, enemas, douches, or sexual intercourse for 6 weeks. Also avoid tub baths, hot tubs, or swimming for 6 weeks.  ° ° °Vaginal Delivery, Care After °Refer to this sheet in the next few weeks. These discharge instructions provide you with information on caring for yourself after delivery. Your caregiver may also give you specific instructions. Your treatment has been planned according to the most current medical practices available, but problems sometimes occur. Call your caregiver if you have any problems or questions after you go home. °HOME CARE INSTRUCTIONS °1. Take over-the-counter or prescription medicines only as directed by your caregiver or pharmacist. °2. Do not drink alcohol, especially if you are breastfeeding or taking medicine to relieve pain. °3. Do not smoke tobacco. °4. Continue to use good perineal care. Good perineal care includes: °1. Wiping your perineum from back to front °2. Keeping your perineum clean. °3. You can do sitz baths twice a day, to help keep this area clean °5. Do not use tampons, douche or have sex until your caregiver says it is okay. °6. Shower only and avoid sitting in submerged water, aside from sitz baths °7. Wear a well-fitting bra that provides breast support. °8. Eat healthy foods. °9. Drink enough fluids to keep your urine clear or pale yellow. °10. Eat high-fiber foods such as whole grain cereals and breads, brown rice, beans, and fresh fruits and vegetables every day. These foods may help prevent or relieve constipation. °11. Avoid constipation with high fiber foods or medications, such as miralax or metamucil °12. Follow  your caregiver's recommendations regarding resumption of activities such as climbing stairs, driving, lifting, exercising, or traveling. °13. Talk to your caregiver about resuming sexual activities. Resumption of sexual activities is dependent upon your risk of infection, your rate of healing, and your comfort and desire to resume sexual activity. °14. Try to have someone help you with your household activities and your newborn for at least a few days after you leave the hospital. °15. Rest as much as possible. Try to rest or take a nap when your newborn is sleeping. °16. Increase your activities gradually. °17. Keep all of your scheduled postpartum appointments. It is very important to keep your scheduled follow-up appointments. At these appointments, your caregiver will be checking to make sure that you are healing physically and emotionally. °SEEK MEDICAL CARE IF:  °· You are passing large clots from your vagina. Save any clots to show your caregiver. °· You have a foul smelling discharge from your vagina. °· You have trouble urinating. °· You are urinating frequently. °· You have pain when you urinate. °· You have a change in your bowel movements. °· You have increasing redness, pain, or swelling near your vaginal incision (episiotomy) or vaginal tear. °· You have pus draining from your episiotomy or vaginal tear. °· Your episiotomy or vaginal tear is separating. °· You have painful, hard, or reddened breasts. °· You have a severe headache. °· You have blurred vision or see spots. °· You feel sad or depressed. °· You have thoughts of hurting yourself or your newborn. °· You have questions about your care, the care of your newborn, or medicines. °·   You are dizzy or light-headed.  You have a rash.  You have nausea or vomiting.  You were breastfeeding and have not had a menstrual period within 12 weeks after you stopped breastfeeding.  You are not breastfeeding and have not had a menstrual period by the  12th week after delivery.  You have a fever. SEEK IMMEDIATE MEDICAL CARE IF:   You have persistent pain.  You have chest pain.  You have shortness of breath.  You faint.  You have leg pain.  You have stomach pain.  Your vaginal bleeding saturates two or more sanitary pads in 1 hour. MAKE SURE YOU:   Understand these instructions.  Will watch your condition.  Will get help right away if you are not doing well or get worse. Document Released: 07/25/2000 Document Revised: 12/12/2013 Document Reviewed: 03/24/2012 [facility] Patient Information 2015 [place], [place]. This information is not intended to replace advice given to you by your health care provider. Make sure you discuss any questions you have with your health care provider.  Sitz Bath A sitz bath is a warm water bath taken in the sitting position. The water covers only the hips and butt (buttocks). We recommend using one that fits in the toilet, to help with ease of use and cleanliness. It may be used for either healing or cleaning purposes. Sitz baths are also used to relieve pain, itching, or muscle tightening (spasms). The water may contain medicine. Moist heat will help you heal and relax.  HOME CARE  Take 3 to 4 sitz baths a day. 18. Fill the bathtub half-full with warm water. 19. Sit in the water and open the drain a little. 20. Turn on the warm water to keep the tub half-full. Keep the water running constantly. 21. Soak in the water for 15 to 20 minutes. 22. After the sitz bath, pat the affected area dry. GET HELP RIGHT AWAY IF: You get worse instead of better. Stop the sitz baths if you get worse. MAKE SURE YOU:  Understand these instructions.  Will watch your condition.  Will get help right away if you are not doing well or get worse. Document Released: 09/04/2004 Document Revised: 04/21/2012 Document Reviewed: 11/25/2010 [facility] Patient Information 2015 [place], [place]. This information is not intended to  replace advice given to you by your health care provider. Make sure you discuss any questions you have with your health care provider.    Vaginal Delivery, Care After Refer to this sheet in the next few weeks. These discharge instructions provide you with information on caring for yourself after delivery. Your caregiver may also give you specific instructions. Your treatment has been planned according to the most current medical practices available, but problems sometimes occur. Call your caregiver if you have any problems or questions after you go home. HOME CARE INSTRUCTIONS 23. Take over-the-counter or prescription medicines only as directed by your caregiver or pharmacist. 24. Do not drink alcohol, especially if you are breastfeeding or taking medicine to relieve pain. 25. Do not smoke tobacco. 26. Continue to use good perineal care. Good perineal care includes: 1. Wiping your perineum from back to front 2. Keeping your perineum clean. 3. You can do sitz baths twice a day, to help keep this area clean 27. Do not use tampons, douche or have sex until your caregiver says it is okay. 28. Shower only and avoid sitting in submerged water, aside from sitz baths 29. Wear a well-fitting bra that provides breast support. 30. Eat healthy  foods. 31. Drink enough fluids to keep your urine clear or pale yellow. 32. Eat high-fiber foods such as whole grain cereals and breads, brown rice, beans, and fresh fruits and vegetables every day. These foods may help prevent or relieve constipation. 33. Avoid constipation with high fiber foods or medications, such as miralax or metamucil 34. Follow your caregiver's recommendations regarding resumption of activities such as climbing stairs, driving, lifting, exercising, or traveling. 35. Talk to your caregiver about resuming sexual activities. Resumption of sexual activities is dependent upon your risk of infection, your rate of healing, and your comfort and desire  to resume sexual activity. 36. Try to have someone help you with your household activities and your newborn for at least a few days after you leave the hospital. 37. Rest as much as possible. Try to rest or take a nap when your newborn is sleeping. 38. Increase your activities gradually. 39. Keep all of your scheduled postpartum appointments. It is very important to keep your scheduled follow-up appointments. At these appointments, your caregiver will be checking to make sure that you are healing physically and emotionally. SEEK MEDICAL CARE IF:   You are passing large clots from your vagina. Save any clots to show your caregiver.  You have a foul smelling discharge from your vagina.  You have trouble urinating.  You are urinating frequently.  You have pain when you urinate.  You have a change in your bowel movements.  You have increasing redness, pain, or swelling near your vaginal incision (episiotomy) or vaginal tear.  You have pus draining from your episiotomy or vaginal tear.  Your episiotomy or vaginal tear is separating.  You have painful, hard, or reddened breasts.  You have a severe headache.  You have blurred vision or see spots.  You feel sad or depressed.  You have thoughts of hurting yourself or your newborn.  You have questions about your care, the care of your newborn, or medicines.  You are dizzy or light-headed.  You have a rash.  You have nausea or vomiting.  You were breastfeeding and have not had a menstrual period within 12 weeks after you stopped breastfeeding.  You are not breastfeeding and have not had a menstrual period by the 12th week after delivery.  You have a fever. SEEK IMMEDIATE MEDICAL CARE IF:   You have persistent pain.  You have chest pain.  You have shortness of breath.  You faint.  You have leg pain.  You have stomach pain.  Your vaginal bleeding saturates two or more sanitary pads in 1 hour. MAKE SURE YOU:    Understand these instructions.  Will watch your condition.  Will get help right away if you are not doing well or get worse. Document Released: 07/25/2000 Document Revised: 12/12/2013 Document Reviewed: 03/24/2012 [facility] Patient Information 2015 [place], [place]. This information is not intended to replace advice given to you by your health care provider. Make sure you discuss any questions you have with your health care provider.  Sitz Bath A sitz bath is a warm water bath taken in the sitting position. The water covers only the hips and butt (buttocks). We recommend using one that fits in the toilet, to help with ease of use and cleanliness. It may be used for either healing or cleaning purposes. Sitz baths are also used to relieve pain, itching, or muscle tightening (spasms). The water may contain medicine. Moist heat will help you heal and relax.  HOME CARE  Take 3 to  4 sitz baths a day. 40. Fill the bathtub half-full with warm water. 41. Sit in the water and open the drain a little. 42. Turn on the warm water to keep the tub half-full. Keep the water running constantly. 43. Soak in the water for 15 to 20 minutes. 44. After the sitz bath, pat the affected area dry. GET HELP RIGHT AWAY IF: You get worse instead of better. Stop the sitz baths if you get worse. MAKE SURE YOU:  Understand these instructions.  Will watch your condition.  Will get help right away if you are not doing well or get worse. Document Released: 09/04/2004 Document Revised: 04/21/2012 Document Reviewed: 11/25/2010 [facility] Patient Information 2015 [place]. This information is not intended to replace advice given to you by your health care provider. Make sure you discuss any questions you have with your health care provider.

## 2016-06-19 NOTE — Lactation Note (Signed)
This note was copied from a baby's chart. Lactation Consultation Note  Patient Name: Nancy Grimes [id number]'s Date: 06/19/2016 Reason for consult: Follow-up assessment   Maternal Data   Mother has breast fed two other children and reports that breast feeding is going well. Feeding  did not observe, mother declines assistance.  [facility] Score/Interventions                      Lactation Tools Discussed/Used     Consult Status      Trudee GripCarolyn P Azyah Flett 06/19/2016, 12:55 PM

## 2016-06-19 NOTE — Progress Notes (Signed)
Discharge order received from doctor. Reviewed discharge instructions and prescriptions with patient and answered all questions. Follow up appointment instructions given. Patient verbalized understanding. ID bands checked. Patient discharged home with infant via wheelchair by nursing/auxillary.    Wilhelmine Krogstad Garner, RN  

## 2017-04-28 ENCOUNTER — Emergency Department: Payer: 59

## 2017-04-28 ENCOUNTER — Emergency Department
Admission: EM | Admit: 2017-04-28 | Discharge: 2017-04-28 | Disposition: A | Discharge status: Home / Self Care | Payer: 59 | Attending: Emergency Medicine | Admitting: Emergency Medicine

## 2017-04-28 ENCOUNTER — Encounter: Payer: Self-pay | Admitting: Emergency Medicine

## 2017-04-28 DIAGNOSIS — S39012A Strain of muscle, fascia and tendon of lower back, initial encounter: Secondary | ICD-10-CM | POA: Diagnosis not present

## 2017-04-28 DIAGNOSIS — F0781 Postconcussional syndrome: Secondary | ICD-10-CM | POA: Insufficient documentation

## 2017-04-28 DIAGNOSIS — Y939 Activity, unspecified: Secondary | ICD-10-CM | POA: Diagnosis not present

## 2017-04-28 DIAGNOSIS — Z79899 Other long term (current) drug therapy: Secondary | ICD-10-CM | POA: Insufficient documentation

## 2017-04-28 DIAGNOSIS — S3992XA Unspecified injury of lower back, initial encounter: Secondary | ICD-10-CM | POA: Diagnosis present

## 2017-04-28 DIAGNOSIS — Y999 Unspecified external cause status: Secondary | ICD-10-CM | POA: Diagnosis not present

## 2017-04-28 DIAGNOSIS — R51 Headache: Secondary | ICD-10-CM | POA: Diagnosis not present

## 2017-04-28 DIAGNOSIS — Y92410 Unspecified street and highway as the place of occurrence of the external cause: Secondary | ICD-10-CM | POA: Insufficient documentation

## 2017-04-28 LAB — URINALYSIS, COMPLETE (UACMP) WITH MICROSCOPIC
BACTERIA UA: NONE SEEN
Bilirubin Urine: NEGATIVE
Glucose, UA: NEGATIVE mg/dL
Hgb urine dipstick: NEGATIVE
Ketones, ur: NEGATIVE mg/dL
Leukocytes, UA: NEGATIVE
Nitrite: NEGATIVE
PROTEIN: NEGATIVE mg/dL
SPECIFIC GRAVITY, URINE: 1.019 (ref 1.005–1.030)
pH: 7 (ref 5.0–8.0)

## 2017-04-28 LAB — POCT PREGNANCY, URINE: PREG TEST UR: NEGATIVE

## 2017-04-28 MED ORDER — CYCLOBENZAPRINE HCL 5 MG PO TABS: 5.0000 mg | ORAL_TABLET | Freq: Three times a day (TID) | ORAL | 0 refills | Status: AC | PRN
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
## 2018-04-05 ENCOUNTER — Other Ambulatory Visit (HOSPITAL_COMMUNITY)
Admission: RE | Admit: 2018-04-05 | Discharge: 2018-04-05 | Disposition: A | Discharge status: Home / Self Care | Payer: No Typology Code available for payment source | Source: Ambulatory Visit | Attending: Certified Nurse Midwife | Admitting: Certified Nurse Midwife

## 2018-04-05 ENCOUNTER — Ambulatory Visit (INDEPENDENT_AMBULATORY_CARE_PROVIDER_SITE_OTHER): Payer: No Typology Code available for payment source | Admitting: Certified Nurse Midwife

## 2018-04-05 ENCOUNTER — Encounter: Payer: Self-pay | Admitting: Certified Nurse Midwife

## 2018-04-05 VITALS — BP 120/80 | HR 72 | Ht 65.0 in | Wt 208.8 lb

## 2018-04-05 DIAGNOSIS — R102 Pelvic and perineal pain: Secondary | ICD-10-CM

## 2018-04-05 DIAGNOSIS — Z30432 Encounter for removal of intrauterine contraceptive device: Secondary | ICD-10-CM | POA: Diagnosis not present

## 2018-04-05 DIAGNOSIS — Z3009 Encounter for other general counseling and advice on contraception: Secondary | ICD-10-CM

## 2018-04-05 DIAGNOSIS — R1030 Lower abdominal pain, unspecified: Secondary | ICD-10-CM | POA: Diagnosis present

## 2018-04-05 MED ORDER — NORETHIN ACE-ETH ESTRAD-FE 1-20 MG-MCG(24) PO CAPS: 1.0000 | ORAL_CAPSULE | Freq: Every day | ORAL | 0 refills | Status: DC
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
## 2018-04-06 LAB — CBC WITH DIFFERENTIAL/PLATELET
BASOS: 1 %
Basophils Absolute: 0.1 10*3/uL (ref 0.0–0.2)
EOS (ABSOLUTE): 0.1 10*3/uL (ref 0.0–0.4)
Eos: 1 %
HEMOGLOBIN: 14.3 g/dL (ref 11.1–15.9)
Hematocrit: 42 % (ref 34.0–46.6)
IMMATURE GRANS (ABS): 0 10*3/uL (ref 0.0–0.1)
Immature Granulocytes: 0 %
LYMPHS: 38 %
Lymphocytes Absolute: 3.6 10*3/uL — ABNORMAL HIGH (ref 0.7–3.1)
MCH: 27.9 pg (ref 26.6–33.0)
MCHC: 34 g/dL (ref 31.5–35.7)
MCV: 82 fL (ref 79–97)
MONOCYTES: 8 %
Monocytes Absolute: 0.7 10*3/uL (ref 0.1–0.9)
NEUTROS ABS: 4.9 10*3/uL (ref 1.4–7.0)
NEUTROS PCT: 52 %
PLATELETS: 376 10*3/uL (ref 150–450)
RBC: 5.13 x10E6/uL (ref 3.77–5.28)
RDW: 12.9 % (ref 12.3–15.4)
WBC: 9.3 10*3/uL (ref 3.4–10.8)

## 2018-04-06 LAB — CERVICOVAGINAL ANCILLARY ONLY
Chlamydia: NEGATIVE
Neisseria Gonorrhea: NEGATIVE

## 2018-04-06 LAB — BETA HCG QUANT (REF LAB): hCG Quant: <1 m[IU]/mL

## 2018-04-07 NOTE — Progress Notes (Signed)
If this patient calls, tell her that her pregnancy test was negative. I have not been able to contact her.

## 2018-04-10 ENCOUNTER — Emergency Department (HOSPITAL_COMMUNITY): Payer: No Typology Code available for payment source

## 2018-04-10 ENCOUNTER — Emergency Department (HOSPITAL_COMMUNITY)
Admission: EM | Admit: 2018-04-10 | Discharge: 2018-04-10 | Disposition: A | Discharge status: Home / Self Care | Payer: No Typology Code available for payment source | Attending: Emergency Medicine | Admitting: Emergency Medicine

## 2018-04-10 ENCOUNTER — Encounter (HOSPITAL_COMMUNITY): Payer: Self-pay | Admitting: Emergency Medicine

## 2018-04-10 ENCOUNTER — Other Ambulatory Visit: Payer: Self-pay

## 2018-04-10 DIAGNOSIS — M549 Dorsalgia, unspecified: Secondary | ICD-10-CM

## 2018-04-10 DIAGNOSIS — R42 Dizziness and giddiness: Secondary | ICD-10-CM | POA: Insufficient documentation

## 2018-04-10 DIAGNOSIS — M542 Cervicalgia: Secondary | ICD-10-CM

## 2018-04-10 DIAGNOSIS — Z87891 Personal history of nicotine dependence: Secondary | ICD-10-CM | POA: Insufficient documentation

## 2018-04-10 DIAGNOSIS — R1084 Generalized abdominal pain: Secondary | ICD-10-CM | POA: Diagnosis not present

## 2018-04-10 DIAGNOSIS — N939 Abnormal uterine and vaginal bleeding, unspecified: Secondary | ICD-10-CM

## 2018-04-10 DIAGNOSIS — N938 Other specified abnormal uterine and vaginal bleeding: Secondary | ICD-10-CM | POA: Diagnosis not present

## 2018-04-10 DIAGNOSIS — Y939 Activity, unspecified: Secondary | ICD-10-CM | POA: Diagnosis not present

## 2018-04-10 DIAGNOSIS — M545 Low back pain: Secondary | ICD-10-CM | POA: Insufficient documentation

## 2018-04-10 DIAGNOSIS — Y999 Unspecified external cause status: Secondary | ICD-10-CM | POA: Insufficient documentation

## 2018-04-10 DIAGNOSIS — Z79899 Other long term (current) drug therapy: Secondary | ICD-10-CM | POA: Insufficient documentation

## 2018-04-10 DIAGNOSIS — Y92410 Unspecified street and highway as the place of occurrence of the external cause: Secondary | ICD-10-CM | POA: Insufficient documentation

## 2018-04-10 DIAGNOSIS — R0789 Other chest pain: Secondary | ICD-10-CM | POA: Insufficient documentation

## 2018-04-10 DIAGNOSIS — S1081XA Abrasion of other specified part of neck, initial encounter: Secondary | ICD-10-CM | POA: Insufficient documentation

## 2018-04-10 HISTORY — DX: Anemia, unspecified: D64.9

## 2018-04-10 HISTORY — DX: Complete or unspecified spontaneous abortion without complication: O03.9

## 2018-04-10 LAB — CBC WITH DIFFERENTIAL/PLATELET
ABS IMMATURE GRANULOCYTES: 0.1 10*3/uL (ref 0.0–0.1)
Basophils Absolute: 0.1 10*3/uL (ref 0.0–0.1)
Basophils Relative: 0 %
Eosinophils Absolute: 0.1 10*3/uL (ref 0.0–0.7)
Eosinophils Relative: 1 %
HEMATOCRIT: 36.7 % (ref 36.0–46.0)
HEMOGLOBIN: 11.6 g/dL — AB (ref 12.0–15.0)
Immature Granulocytes: 1 %
LYMPHS ABS: 2.9 10*3/uL (ref 0.7–4.0)
LYMPHS PCT: 25 %
MCH: 28 pg (ref 26.0–34.0)
MCHC: 31.6 g/dL (ref 30.0–36.0)
MCV: 88.6 fL (ref 78.0–100.0)
MONOS PCT: 8 %
Monocytes Absolute: 0.9 10*3/uL (ref 0.1–1.0)
Neutro Abs: 7.4 10*3/uL (ref 1.7–7.7)
Neutrophils Relative %: 65 %
Platelets: 281 10*3/uL (ref 150–400)
RBC: 4.14 MIL/uL (ref 3.87–5.11)
RDW: 13 % (ref 11.5–15.5)
WBC: 11.4 10*3/uL — AB (ref 4.0–10.5)

## 2018-04-10 LAB — I-STAT BETA HCG BLOOD, ED (MC, WL, AP ONLY): I-stat hCG, quantitative: <5 m[IU]/mL (ref <5)

## 2018-04-10 LAB — COMPREHENSIVE METABOLIC PANEL
ALT: 49 U/L — ABNORMAL HIGH (ref 0–44)
AST: 27 U/L (ref 15–41)
Albumin: 4 g/dL (ref 3.5–5.0)
Alkaline Phosphatase: 64 U/L (ref 38–126)
Anion gap: 9 (ref 5–15)
BUN: 16 mg/dL (ref 6–20)
CHLORIDE: 108 mmol/L (ref 98–111)
CO2: 25 mmol/L (ref 22–32)
Calcium: 9.5 mg/dL (ref 8.9–10.3)
Creatinine, Ser: 0.71 mg/dL (ref 0.44–1.00)
Glucose, Bld: 109 mg/dL — ABNORMAL HIGH (ref 70–99)
POTASSIUM: 3.8 mmol/L (ref 3.5–5.1)
Sodium: 142 mmol/L (ref 135–145)
Total Bilirubin: 0.5 mg/dL (ref 0.3–1.2)
Total Protein: 6.7 g/dL (ref 6.5–8.1)

## 2018-04-10 MED ORDER — NAPROXEN 250 MG PO TABS
500.0000 mg | ORAL_TABLET | Freq: Once | ORAL | Status: AC
  Administered 2018-04-10: 500 mg via ORAL
  Filled 2018-04-10: qty 2

## 2018-04-10 MED ORDER — CYCLOBENZAPRINE HCL 10 MG PO TABS
5.0000 mg | ORAL_TABLET | Freq: Once | ORAL | Status: AC
  Administered 2018-04-10: 5 mg via ORAL
  Filled 2018-04-10: qty 1

## 2018-04-10 MED ORDER — IOPAMIDOL (ISOVUE-300) INJECTION 61%
INTRAVENOUS | Status: AC
  Filled 2018-04-10: qty 100

## 2018-04-10 MED ORDER — IOPAMIDOL (ISOVUE-300) INJECTION 61%
100.0000 mL | Freq: Once | INTRAVENOUS | Status: AC | PRN
  Administered 2018-04-10: 100 mL via INTRAVENOUS

## 2018-04-10 MED ORDER — FENTANYL CITRATE (PF) 100 MCG/2ML IJ SOLN
50.0000 ug | Freq: Once | INTRAMUSCULAR | Status: AC
  Administered 2018-04-10: 50 ug via INTRAVENOUS
  Filled 2018-04-10: qty 2

## 2018-04-10 MED ORDER — CYCLOBENZAPRINE HCL 10 MG PO TABS: 10.0000 mg | ORAL_TABLET | Freq: Two times a day (BID) | ORAL | 0 refills | Status: DC | PRN

## 2018-04-10 MED ORDER — SODIUM CHLORIDE 0.9 % IV BOLUS
1000.0000 mL | Freq: Once | INTRAVENOUS | Status: AC
  Administered 2018-04-10: 1000 mL via INTRAVENOUS

## 2018-04-10 NOTE — ED Notes (Signed)
Patient transported to CT 
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
## 2018-04-10 NOTE — ED Notes (Signed)
ED Provider at bedside. 

## 2018-04-10 NOTE — Discharge Instructions (Addendum)
Your CT imaging was reassuring.  Motrin around-the-clock at home for your pain along with Tylenol. Please the the flexeril for muscle relaxation. This medication will make you drowsy so avoid situation that could place you in danger.   I did speak with the OB/GYN doctor who recommends you taking 2 doses of your birth control daily until the bleeding stops and then take once a day.  Please follow-up with your OB/GYN doctor this week for recheck of your blood counts and for your vaginal bleeding.  Return the ED if you develop any worsening lightheadedness, dizziness, palpitations,'s passing out or for any other reason.

## 2018-04-10 NOTE — ED Notes (Signed)
Pt verbalized understanding of dc instructions, vss, nad.  pt taken to lobby in wheelchair to wait on ride with her father.
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
## 2018-04-20 ENCOUNTER — Telehealth: Payer: Self-pay

## 2018-04-20 ENCOUNTER — Other Ambulatory Visit: Payer: Self-pay | Admitting: Certified Nurse Midwife

## 2018-04-20 MED ORDER — NORETHIN ACE-ETH ESTRAD-FE 1-20 MG-MCG(24) PO TABS: 1.0000 | ORAL_TABLET | Freq: Every day | ORAL | 2 refills | Status: DC

## 2018-04-20 NOTE — Progress Notes (Signed)
PAtient unable to afford Taytulla. Will call in a RX for Junel 24 or generic of that. Bleeding has stopped by taking 2 pills daily of her current pill pack.
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
## 2018-04-21 ENCOUNTER — Encounter: Payer: Self-pay | Admitting: Physician Assistant

## 2018-04-21 ENCOUNTER — Other Ambulatory Visit: Payer: Self-pay

## 2018-04-21 ENCOUNTER — Ambulatory Visit: Payer: No Typology Code available for payment source | Admitting: Physician Assistant

## 2018-04-21 VITALS — BP 122/78 | HR 88 | Temp 98.7°F | Resp 18 | Ht 64.76 in | Wt 208.0 lb

## 2018-04-21 DIAGNOSIS — M7918 Myalgia, other site: Secondary | ICD-10-CM

## 2018-04-21 DIAGNOSIS — N939 Abnormal uterine and vaginal bleeding, unspecified: Secondary | ICD-10-CM | POA: Diagnosis not present

## 2018-04-21 DIAGNOSIS — D649 Anemia, unspecified: Secondary | ICD-10-CM | POA: Diagnosis not present

## 2018-04-21 DIAGNOSIS — Z7689 Persons encountering health services in other specified circumstances: Secondary | ICD-10-CM

## 2018-04-21 LAB — POCT CBC
Granulocyte percent: 56.7 %G (ref 37–80)
HEMATOCRIT: 35.5 % — AB (ref 37.7–47.9)
Hemoglobin: 11.7 g/dL — AB (ref 12.2–16.2)
LYMPH, POC: 3.2 (ref 0.6–3.4)
MCH, POC: 27.9 pg (ref 27–31.2)
MCHC: 33.1 g/dL (ref 31.8–35.4)
MCV: 84.4 fL (ref 80–97)
MID (CBC): 0.3 (ref 0–0.9)
MPV: 6.6 fL (ref 0–99.8)
POC Granulocyte: 4.5 (ref 2–6.9)
POC LYMPH %: 39.6 % (ref 10–50)
POC MID %: 3.7 % (ref 0–12)
Platelet Count, POC: 387 10*3/uL (ref 142–424)
RBC: 4.2 M/uL (ref 4.04–5.48)
RDW, POC: 13.4 %
WBC: 8 10*3/uL (ref 4.6–10.2)

## 2018-04-21 MED ORDER — CYCLOBENZAPRINE HCL 5 MG PO TABS: 5.0000 mg | ORAL_TABLET | Freq: Three times a day (TID) | ORAL | 0 refills | Status: DC | PRN

## 2018-04-21 NOTE — Patient Instructions (Addendum)
     It was a pleasure meeting you today.Continue taking daily iron.  I have given you a prescription for Flexeril to use as needed for muscle pains.  This can make you drowsy.  Also recommend stretching and applying heating pad to affected areas.  Plan to follow-up in 3 months for CPE.   If you have lab work done today you will be contacted with your lab results within the next 2 weeks.  If you have not heard from [place] then please contact us. The fastest way to get your results is to register for My Chart.   IF you received an x-ray today, you will receive an invoice from [facility]. Please contact [facility] at [phone] with questions or concerns regarding your invoice.   IF you received labwork today, you will receive an invoice from [place]. Please contact [facility] at [phone] with questions or concerns regarding your invoice.   Our billing staff will not be able to assist you with questions regarding bills from these companies.  You will be contacted with the lab results as soon as they are available. The fastest way to get your results is to activate your My Chart account. Instructions are located on the last page of this paperwork. If you have not heard from [place] regarding the results in 2 weeks, please contact this office.
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
## 2018-06-16 ENCOUNTER — Other Ambulatory Visit (HOSPITAL_COMMUNITY)
Admission: RE | Admit: 2018-06-16 | Discharge: 2018-06-16 | Disposition: A | Discharge status: Home / Self Care | Payer: No Typology Code available for payment source | Source: Ambulatory Visit | Attending: Certified Nurse Midwife | Admitting: Certified Nurse Midwife

## 2018-06-16 ENCOUNTER — Encounter: Payer: Self-pay | Admitting: Certified Nurse Midwife

## 2018-06-16 ENCOUNTER — Ambulatory Visit (INDEPENDENT_AMBULATORY_CARE_PROVIDER_SITE_OTHER): Payer: No Typology Code available for payment source | Admitting: Certified Nurse Midwife

## 2018-06-16 VITALS — BP 120/80 | HR 60 | Ht 65.0 in | Wt 202.0 lb

## 2018-06-16 DIAGNOSIS — Z01419 Encounter for gynecological examination (general) (routine) without abnormal findings: Secondary | ICD-10-CM

## 2018-06-16 DIAGNOSIS — Z124 Encounter for screening for malignant neoplasm of cervix: Secondary | ICD-10-CM | POA: Insufficient documentation

## 2018-06-16 DIAGNOSIS — Z3041 Encounter for surveillance of contraceptive pills: Secondary | ICD-10-CM

## 2018-06-16 DIAGNOSIS — E66811 Obesity, class 1: Secondary | ICD-10-CM

## 2018-06-16 DIAGNOSIS — G43009 Migraine without aura, not intractable, without status migrainosus: Secondary | ICD-10-CM

## 2018-06-16 DIAGNOSIS — E669 Obesity, unspecified: Secondary | ICD-10-CM | POA: Insufficient documentation

## 2018-06-16 MED ORDER — NORETHIN ACE-ETH ESTRAD-FE 1-20 MG-MCG(24) PO TABS: 1.0000 | ORAL_TABLET | Freq: Every day | ORAL | 11 refills | Status: DC
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
## 2018-06-18 LAB — CYTOLOGY - PAP: Diagnosis: NEGATIVE

## 2018-06-23 ENCOUNTER — Telehealth: Payer: Self-pay | Admitting: General Practice

## 2018-06-23 NOTE — Telephone Encounter (Signed)
°  LVM for pt to call the office and get rescheduled [facility]. I advised of the providers that are accepting new patients. If/When the pt calls back, please reschedule with Chyrel MassonStallings, [place] or [facility] for a CPE.  Thank you!

## 2018-07-14 ENCOUNTER — Encounter: Payer: No Typology Code available for payment source | Admitting: Physician Assistant

## 2018-07-23 ENCOUNTER — Telehealth: Payer: Self-pay

## 2018-07-23 NOTE — Telephone Encounter (Signed)
Pt would like to have bc switched to a diff pharm.  [phone]

## 2018-07-27 NOTE — Telephone Encounter (Signed)
Left detailed msg apologizing that I had not called her back sooner.  I need her to let me know what pharm she wants to change to - either nurse line or call and ask for me.

## 2018-07-30 NOTE — Telephone Encounter (Signed)
Pt hasn't returned call.  Msg closed. 

## 2019-02-17 ENCOUNTER — Encounter: Payer: Self-pay | Admitting: Family Medicine

## 2019-02-17 ENCOUNTER — Other Ambulatory Visit: Payer: Self-pay

## 2019-02-17 ENCOUNTER — Ambulatory Visit (INDEPENDENT_AMBULATORY_CARE_PROVIDER_SITE_OTHER): Payer: No Typology Code available for payment source | Admitting: Family Medicine

## 2019-02-17 VITALS — BP 120/70 | HR 66 | Temp 98.5°F | Ht 64.0 in | Wt 203.2 lb

## 2019-02-17 DIAGNOSIS — L239 Allergic contact dermatitis, unspecified cause: Secondary | ICD-10-CM

## 2019-02-17 DIAGNOSIS — N926 Irregular menstruation, unspecified: Secondary | ICD-10-CM

## 2019-02-17 DIAGNOSIS — L230 Allergic contact dermatitis due to metals: Secondary | ICD-10-CM | POA: Insufficient documentation

## 2019-02-17 LAB — POCT URINE PREGNANCY: Preg Test, Ur: POSITIVE — AB

## 2019-02-17 NOTE — Progress Notes (Signed)
Acute Office Visit  Subjective:    Patient ID: Anola GurneyHayden D Howes, female    DOB: Sep 04, 1990, 28 y.o.   MRN: [id number]  Chief Complaint  Patient presents with  . Rash    on forhead, face, underchin and chest.     HPI Patient is in today for allergic reaction on the face. Pt does not know cause. Pt states she did change facial wipes so this may be the source.  No SOB.  NO difficulty with sight. Took benadryl yesterday. Used son's topical steroids on face  Past Medical History:  Diagnosis Date  . Anemia   . Chronic headaches   . Miscarriage   . Rh negative state in antepartum period, third trimester     Past Surgical History:  Procedure Laterality Date  . ARM SURGERY  2007   RIGHT ELBOW  . VAGINAL DELIVERY     x3    Family History  Problem Relation Age of Onset  . Hypertension Father   . Hyperlipidemia Father   . Diabetes Paternal Aunt        type 2  . Diabetes Paternal Grandmother        type2  . Thyroid disease Cousin   . Hyperlipidemia Maternal Grandmother   . Heart disease Maternal Grandmother   . Heart disease Maternal Grandfather   . Hyperlipidemia Maternal Grandfather   . Stroke Maternal Grandfather     Social History   Socioeconomic History  . Marital status: Married    Spouse name: Not on file  . Number of children: 4  . Years of education: 6013  . Highest education level: Not on file  Occupational History  . Occupation: stay at home mom  . 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 Financial resource strain: Not on file  . Food insecurity    Worry: Not on file    Inability: Not on file  . Transportation needs    Medical: Not on file    Non-medical: Not on file  Tobacco Use  . Smoking status: Former Smoker    Packs/day: 0.25    Years: 2.00    Pack years: 0.50    Quit date: 04/21/2012    Years since quitting: 6.8  . Smokeless tobacco: Never Used  Substance and Sexual Activity  . Alcohol use: No    Comment:  occasionally-half a glass a week  . Drug use: No  . Sexual activity: Yes    Partners: Male    Birth control/protection: Pill    Comment: with monogamous husband  Lifestyle  . Physical activity    Days per week: Not on file    Minutes per session: Not on file  . Stress: Not on file  Relationships  . Social Musicianconnections    Talks on phone: Not on file    Gets together: Not on file    Attends religious service: Not on file    Active member of club or organization: Not on file    Attends meetings of clubs or organizations: Not on file    Relationship status: Not on file  . Intimate partner violence    Fear of current or ex partner: Not on file    Emotionally abused: Not on file    Physically abused: Not on file    Forced sexual activity: Not on file  Other Topics Concern  . Not on file  Social History Narrative  . Not on file    Outpatient  Medications Prior to Visit  Medication Sig Dispense Refill  . acetaminophen (TYLENOL) 325 MG tablet Take 650 mg by mouth every 6 (six) hours as needed for mild pain.     . ferrous sulfate 325 (65 FE) MG tablet Take 1 tablet (325 mg total) by mouth 2 (two) times daily with a meal. (Patient taking differently: Take 325 mg by mouth daily with breakfast. ) 30 tablet 3  . cyclobenzaprine (FLEXERIL) 5 MG tablet Take 1 tablet (5 mg total) by mouth 3 (three) times daily as needed for muscle spasms. 60 tablet 0  . ibuprofen (ADVIL,MOTRIN) 200 MG tablet Take 400 mg by mouth every 6 (six) hours as needed for mild pain.     . Norethindrone Acetate-Ethinyl Estrad-FE (JUNEL FE 24) 1-20 MG-MCG(24) tablet Take 1 tablet by mouth daily. (Patient not taking: Reported on 02/17/2019) 1 Package 11   No facility-administered medications prior to visit.     Allergies  Allergen Reactions  . Other     SKIN ALLERGY-ACNE RX; salycylic acid  . Penicillin G Swelling  . Penicillins Swelling    Review of Systems  Respiratory: Negative for cough and shortness of breath.    Skin: Positive for itching and rash.       Objective:    Physical Exam  Constitutional: She appears well-developed and well-nourished.  Cardiovascular: Normal rate and regular rhythm.  Pulmonary/Chest: Breath sounds normal.  Skin: Rash noted. There is erythema.  macules on forehead, cheeks and chest  BP 120/70 (BP Location: Right Arm, Patient Position: Sitting, Cuff Size: Normal)   Pulse 66   Temp 98.5 F (36.9 C) (Oral)   Ht 5\' 4"  (1.626 m)   Wt 203 lb 3.2 oz (92.2 kg)   LMP 12/14/2018   SpO2 98%   BMI 34.88 kg/m  Wt Readings from Last 3 Encounters:  02/17/19 203 lb 3.2 oz (92.2 kg)  06/16/18 202 lb (91.6 kg)  04/21/18 208 lb (94.3 kg)    Lab Results  Component Value Date   TSH 1.313 10/27/2011   Lab Results  Component Value Date   WBC 8.0 04/21/2018   HGB 11.7 (A) 04/21/2018   HCT 35.5 (A) 04/21/2018   MCV 84.4 04/21/2018   PLT 281 04/10/2018   Lab Results  Component Value Date   NA 142 04/10/2018   K 3.8 04/10/2018   CO2 25 04/10/2018   GLUCOSE 109 (H) 04/10/2018   BUN 16 04/10/2018   CREATININE 0.71 04/10/2018   BILITOT 0.5 04/10/2018   ALKPHOS 64 04/10/2018   AST 27 04/10/2018   ALT 49 (H) 04/10/2018   PROT 6.7 04/10/2018   ALBUMIN 4.0 04/10/2018   CALCIUM 9.5 04/10/2018   ANIONGAP 9 04/10/2018   No results found for: CHOL No results found for: HDL No results found for: LDLCALC No results found for: TRIG No results found for: CHOLHDL No results found for: HGBA1C     Assessment & Plan:   Problem List Items Addressed This Visit    None    Visit Diagnoses    Missed menses    -  Primary   Relevant Orders   POCT urine pregnancy (Completed)     1. Missed menses Pregnancy positive-d/w pt-pt has no idea on states-no period for 2+ months. Stopped BCP1.5 months ago-previously 3rd trimester prior to having pregnancy test completed per pt - POCT urine pregnancy+ Pt to contact OB/GYN for visit for u/s dates-concern for medication due to unknown  dates-d/w pt concern for pregnancy with  medications orally for dermatitis  2. Allergic contact dermatitis, unspecified trigger claritin Topical low potency steroids, caladryl  UC for evaluaition if SOB/diffiuclty swallowing, seeing or other concerns  LISA Mat CarneLEIGH CORUM, MD

## 2019-02-17 NOTE — Patient Instructions (Signed)
° ° ° °  If you have lab work done today you will be contacted with your lab results within the next 2 weeks.  If you have not heard from us then please contact us. The fastest way to get your results is to register for My Chart. ° ° °IF you received an x-ray today, you will receive an invoice from [facility]. Please contact [facility] at [phone] with questions or concerns regarding your invoice.  ° °IF you received labwork today, you will receive an invoice from [facility]. Please contact [facility] at [phone] with questions or concerns regarding your invoice.  ° °Our billing staff will not be able to assist you with questions regarding bills from these companies. ° °You will be contacted with the lab results as soon as they are available. The fastest way to get your results is to activate your My Chart account. Instructions are located on the last page of this paperwork. If you have not heard from us regarding the results in 2 weeks, please contact this office. °  ° ° ° °

## 2019-02-18 ENCOUNTER — Encounter (HOSPITAL_COMMUNITY): Payer: Self-pay

## 2019-02-18 ENCOUNTER — Ambulatory Visit (HOSPITAL_COMMUNITY)
Admission: EM | Admit: 2019-02-18 | Discharge: 2019-02-18 | Disposition: A | Discharge status: Home / Self Care | Payer: No Typology Code available for payment source | Attending: Emergency Medicine | Admitting: Emergency Medicine

## 2019-02-18 ENCOUNTER — Telehealth: Payer: Self-pay | Admitting: Family Medicine

## 2019-02-18 DIAGNOSIS — R21 Rash and other nonspecific skin eruption: Secondary | ICD-10-CM

## 2019-02-18 MED ORDER — PREDNISONE 20 MG PO TABS: ORAL_TABLET | ORAL | 0 refills | Status: DC

## 2019-02-18 MED ORDER — PREDNISONE 10 MG PO TABS
ORAL_TABLET | ORAL | Status: AC
  Filled 2019-02-18: qty 1

## 2019-02-18 MED ORDER — PREDNISONE 20 MG PO TABS
ORAL_TABLET | ORAL | Status: AC
  Filled 2019-02-18: qty 2

## 2019-02-18 MED ORDER — PREDNISONE 20 MG PO TABS
50.0000 mg | ORAL_TABLET | Freq: Once | ORAL | Status: AC
  Administered 2019-02-18: 50 mg via ORAL

## 2019-02-18 NOTE — ED Provider Notes (Signed)
[facility]    CSN: [id number] Arrival date & time: 02/18/19  [place]     History   Chief Complaint Chief Complaint  Patient presents with  . Rash    HPI CAI FLOTT is a 28 y.o. female.   Nancy Grimes presents with complaints of rash to face, chest, trunk and arms. First noted yesterday. Her daughter had brought her flowers from outside, which her neighbor later mentioned that looked to have poison oak in it. Patient has had similar response to poison oak/ivy in the past. Rash itches and burns. Has been using topical steroid and taking benadryl which has minimally helped. Today feels like her throat feels tight. She is swallowing and drinking without difficulty. No shortness of breath . No chest pain . She is pregnant, she thinks she is approximately [redacted] weeks pregnant but has not had a dating ultrasound yet. Spoke to her OB on the phone today who confirmed that patient likely would need steroids for rash. Hx of allergic dermatitis, migraines.     ROS per HPI, negative if not otherwise mentioned.      Past Medical History:  Diagnosis Date  . Anemia   . Chronic headaches   . Miscarriage   . Rh negative state in antepartum period, third trimester     Patient Active Problem List   Diagnosis Date Noted  . Allergic contact dermatitis due to metals 02/17/2019  . Missed menses 02/17/2019  . Common migraine 06/16/2018  . Obesity (BMI 30.0-34.9) 06/16/2018    Past Surgical History:  Procedure Laterality Date  . ARM SURGERY  2007   RIGHT ELBOW  . VAGINAL DELIVERY     x3    OB History    Gravida  5   Para  3   Term  3   Preterm      AB  1   Living  3     SAB  1   TAB      Ectopic      Multiple  0   Live Births  3            Home Medications    Prior to Admission medications   Medication Sig Start Date End Date Taking? Authorizing Provider  acetaminophen (TYLENOL) 325 MG tablet Take 650 mg by mouth every 6 (six) hours as needed  for mild pain.     [provider]  ferrous sulfate 325 (65 FE) MG tablet Take 1 tablet (325 mg total) by mouth 2 (two) times daily with a meal. Patient taking differently: Take 325 mg by mouth daily with breakfast.  05/10/15   Darliss Cheney, CNM  predniSONE (DELTASONE) 20 MG tablet 3-3-3-2-2-2-1-1-1 02/18/19   Zigmund Gottron, NP    Family History Family History  Problem Relation Age of Onset  . Hypertension Father   . Hyperlipidemia Father   . Diabetes Paternal Aunt        type 2  . Diabetes Paternal Grandmother        type2  . Thyroid disease Cousin   . Hyperlipidemia Maternal Grandmother   . Heart disease Maternal Grandmother   . Heart disease Maternal Grandfather   . Hyperlipidemia Maternal Grandfather   . Stroke Maternal Grandfather     Social History Social History   Tobacco Use  . Smoking status: Former Smoker    Packs/day: 0.25    Years: 2.00    Pack years: 0.50    Quit date: 04/21/2012  Years since quitting: 6.8  . Smokeless tobacco: Never Used  Substance Use Topics  . Alcohol use: No    Comment: occasionally-half a glass a week  . Drug use: No     Allergies   Other, Penicillin g, and Penicillins   Review of Systems Review of Systems   Physical Exam Triage Vital Signs ED Triage Vitals  Enc Vitals Group     BP 02/18/19 1907 116/65     Pulse Rate 02/18/19 1907 90     Resp 02/18/19 1907 18     Temp 02/18/19 1907 97.9 F (36.6 C)     Temp Source 02/18/19 1907 Oral     SpO2 02/18/19 1907 97 %     Weight 02/18/19 1911 200 lb (90.7 kg)     Height --      Head Circumference --      Peak Flow --      Pain Score 02/18/19 1910 8     Pain Loc --      Pain Edu? --      Excl. in GC? --    No data found.  Updated Vital Signs BP 116/65 (BP Location: Left Arm)   Pulse 90   Temp 97.9 F (36.6 C) (Oral)   Resp 18   Wt 200 lb (90.7 kg)   LMP 12/14/2018 (LMP Unknown)   SpO2 97%   Breastfeeding Unknown Comment: Was recently pregnant   BMI 34.33 kg/m    Physical Exam Constitutional:      General: She is not in acute distress.    Appearance: She is well-developed.  HENT:     Head:     Comments: Speaking and swallowing without difficulty, drinking a soda  Cardiovascular:     Rate and Rhythm: Normal rate.  Pulmonary:     Effort: Pulmonary effort is normal.  Skin:    General: Skin is warm and dry.     Findings: Rash present. Rash is vesicular.     Comments: Red raised and vesicular rash to entire forehead and face, worse on left face; noted to chest, under bilateral breasts and to left antecubital space   Neurological:     Mental Status: She is alert and oriented to person, place, and time.      UC Treatments / Results  Labs (all labs ordered are listed, but only abnormal results are displayed) Labs Reviewed - No data to display  EKG   Radiology No results found.  Procedures Procedures (including critical care time)  Medications Ordered in UC Medications  predniSONE (DELTASONE) tablet 50 mg (50 mg Oral Given 02/18/19 2020)  predniSONE (DELTASONE) 10 MG tablet (has no administration in time range)  predniSONE (DELTASONE) 20 MG tablet (has no administration in time range)    Initial Impression / Assessment and Plan / UC Course  I have reviewed the triage vital signs and the nursing notes.  Pertinent labs & imaging results that were available during my care of the patient were reviewed by me and considered in my medical decision making (see chart for details).     Contact dermatitis. No difficulty speaking or breathing. O2 stable. Oral prednisone provided before departure and course provided. Return precautions provided. Patient verbalized understanding and agreeable to plan.    Final Clinical Impressions(s) / UC Diagnoses   Final diagnoses:  Rash     Discharge Instructions     Course of prednisone for your rash.  Continue to follow up with your PCP and/or OB for  management as needed.      ED Prescriptions    Medication Sig Dispense Auth. Provider   predniSONE (DELTASONE) 20 MG tablet 3-3-3-2-2-2-1-1-1 18 tablet Linus MakoBurky, Iyahna Obriant B, NP     Controlled Substance Prescriptions [place] Controlled Substance Registry consulted? Not Applicable   Georgetta HaberBurky, Armilda Vanderlinden B, NP 02/18/19 2047

## 2019-02-18 NOTE — ED Triage Notes (Signed)
Pt here for rash since yesterday, did see her pcp yesterday and was given a topical cream and isn't helping. It is red, itching and feeling tightness in her throat so wanted to get a steroid shot. Did take benadryl 6 hours ago. Red, itchy, burning rash that she thinks came from the flowers her daughter picked her. Is located on her face, arms, chest and generally her whole body

## 2019-02-18 NOTE — Telephone Encounter (Signed)
Copied from [facility] [phone]. Topic: Referral - Request for Referral >> Feb 18, 2019 10:42 AM Yvette Rack wrote: Has patient seen PCP for this complaint? yes  *If NO, is insurance requiring patient see PCP for this issue before PCP can refer them? Referral for which specialty: OBGYN Preferred provider/office: no specific provider Reason for referral: pt is pregnant and may need steroid shot

## 2019-02-18 NOTE — Discharge Instructions (Addendum)
Course of prednisone for your rash.  Continue to follow up with your PCP and/or OB for management as needed.

## 2019-02-26 NOTE — Telephone Encounter (Signed)
Pt has appt with ob on 03/14/2019 at 9:10 am Dgaddy, CMA

## 2019-03-01 ENCOUNTER — Telehealth: Payer: Self-pay | Admitting: Family Medicine

## 2019-03-01 NOTE — Telephone Encounter (Signed)
Medication: predniSONE (DELTASONE) 20 MG tablet    Patient is requesting a refill of this medication.    Pharmacy:  [facility] [place], [place] [phone] (Phone) [phone] (Fax)

## 2019-03-04 ENCOUNTER — Ambulatory Visit (HOSPITAL_COMMUNITY)
Admission: EM | Admit: 2019-03-04 | Discharge: 2019-03-04 | Disposition: A | Discharge status: Home / Self Care | Payer: PRIVATE HEALTH INSURANCE | Attending: Family Medicine | Admitting: Family Medicine

## 2019-03-04 ENCOUNTER — Other Ambulatory Visit: Payer: Self-pay

## 2019-03-04 ENCOUNTER — Encounter (HOSPITAL_COMMUNITY): Payer: Self-pay | Admitting: Emergency Medicine

## 2019-03-04 DIAGNOSIS — R21 Rash and other nonspecific skin eruption: Secondary | ICD-10-CM

## 2019-03-04 DIAGNOSIS — L239 Allergic contact dermatitis, unspecified cause: Secondary | ICD-10-CM

## 2019-03-04 MED ORDER — TRIAMCINOLONE ACETONIDE 0.1 % EX CREA: 1.0000 "application " | TOPICAL_CREAM | Freq: Two times a day (BID) | CUTANEOUS | 0 refills | Status: DC

## 2019-03-04 MED ORDER — PREDNISONE 10 MG (21) PO TBPK: ORAL_TABLET | Freq: Every day | ORAL | 0 refills | Status: DC

## 2019-03-04 NOTE — ED Triage Notes (Signed)
Pt states she was seen here on 7/10 for the same rash, states the treatment she was given made it almost go away, but it flaired back up again.

## 2019-03-04 NOTE — Discharge Instructions (Signed)
We will repeat prednisone taper- 6 tabs today, decrease by 1 tab each day until you take 1 tab on day 6  Daily cetirizine, may supplement with benadryl  Follow up with OBGYN if rash still persisting

## 2019-03-04 NOTE — ED Provider Notes (Signed)
[facility]    CSN: [id number] Arrival date & time: 03/04/19  1332      History   Chief Complaint Chief Complaint  Patient presents with  . Rash    HPI Nancy Grimes is a 28 y.o. female currently approximately [redacted] weeks pregnant per patient, presenting today for evaluation of a rash.  Patient has had a rash to her upper extremities, abdomen and lower extremities and face which has progressed over the past few days.  She had been seen here for similar rash on 7/10.  She was treated with 9 days of prednisone and symptoms largely resolved except a small area on her left arm.  She states that shortly after finishing the prednisone she feels her symptoms flared up when she was in the heat again.  Denies any other new known exposures of change in hygiene products, soaps, lotions, detergents, foods or medicines.  She believes initial trigger was poison oak which she was exposed to from a neighbor's yard.  Has had similar reactions in the past.  And concern is that it is spreading to face and getting close to her eyes.  No close contacts with similar rashes.  Denies issues with rash/skin prior to recently.  Has initial OB appointment on August 2.  HPI  Past Medical History:  Diagnosis Date  . Anemia   . Chronic headaches   . Miscarriage   . Rh negative state in antepartum period, third trimester     Patient Active Problem List   Diagnosis Date Noted  . Allergic contact dermatitis due to metals 02/17/2019  . Missed menses 02/17/2019  . Common migraine 06/16/2018  . Obesity (BMI 30.0-34.9) 06/16/2018    Past Surgical History:  Procedure Laterality Date  . ARM SURGERY  2007   RIGHT ELBOW  . VAGINAL DELIVERY     x3    OB History    Gravida  6   Para  3   Term  3   Preterm      AB  1   Living  3     SAB  1   TAB      Ectopic      Multiple  0   Live Births  3            Home Medications    Prior to Admission medications   Medication Sig  Start Date End Date Taking? Authorizing Provider  acetaminophen (TYLENOL) 325 MG tablet Take 650 mg by mouth every 6 (six) hours as needed for mild pain.     [provider]  ferrous sulfate 325 (65 FE) MG tablet Take 1 tablet (325 mg total) by mouth 2 (two) times daily with a meal. Patient taking differently: Take 325 mg by mouth daily with breakfast.  05/10/15   Sigmon, Tyler Deis, CNM  predniSONE (STERAPRED UNI-PAK 21 TAB) 10 MG (21) TBPK tablet Take by mouth daily. Take as directed 03/04/19   [name], PA-C  triamcinolone cream (KENALOG) 0.1 % Apply 1 application topically 2 (two) times daily. 03/04/19   Sabrinia Prien, Elesa Hacker, PA-C    Family History Family History  Problem Relation Age of Onset  . Hypertension Father   . Hyperlipidemia Father   . Diabetes Paternal Aunt        type 2  . Diabetes Paternal Grandmother        type2  . Thyroid disease Cousin   . Hyperlipidemia Maternal Grandmother   . Heart disease Maternal  Grandmother   . Heart disease Maternal Grandfather   . Hyperlipidemia Maternal Grandfather   . Stroke Maternal Grandfather     Social History Social History   Tobacco Use  . Smoking status: Former Smoker    Packs/day: 0.25    Years: 2.00    Pack years: 0.50    Quit date: 04/21/2012    Years since quitting: 6.8  . Smokeless tobacco: Never Used  Substance Use Topics  . Alcohol use: No    Comment: occasionally-half a glass a week  . Drug use: No     Allergies   Other, Penicillin g, and Penicillins   Review of Systems Review of Systems  Constitutional: Negative for fatigue and fever.  HENT: Negative for mouth sores.   Eyes: Negative for visual disturbance.  Respiratory: Negative for shortness of breath.   Cardiovascular: Negative for chest pain.  Gastrointestinal: Negative for abdominal pain, nausea and vomiting.  Genitourinary: Negative for genital sores.  Musculoskeletal: Negative for arthralgias and joint swelling.  Skin: Positive  for rash. Negative for color change and wound.  Neurological: Negative for dizziness, weakness, light-headedness and headaches.     Physical Exam Triage Vital Signs ED Triage Vitals  Enc Vitals Group     BP 03/04/19 1404 126/78     Pulse Rate 03/04/19 1404 100     Resp 03/04/19 1404 18     Temp 03/04/19 1405 98.1 F (36.7 C)     Temp src --      SpO2 03/04/19 1404 99 %     Weight --      Height --      Head Circumference --      Peak Flow --      Pain Score 03/04/19 1434 4     Pain Loc --      Pain Edu? --      Excl. in GC? --    No data found.  Updated Vital Signs BP 126/78   Pulse 100   Temp 98.1 F (36.7 C)   Resp 18   LMP 12/14/2018 (LMP Unknown)   SpO2 99%   Visual Acuity Right Eye Distance:   Left Eye Distance:   Bilateral Distance:    Right Eye Near:   Left Eye Near:    Bilateral Near:     Physical Exam Vitals signs and nursing note reviewed.  Constitutional:      Appearance: She is well-developed.     Comments: No acute distress  HENT:     Head: Normocephalic and atraumatic.     Nose: Nose normal.  Eyes:     Conjunctiva/sclera: Conjunctivae normal.  Neck:     Musculoskeletal: Neck supple.  Cardiovascular:     Rate and Rhythm: Normal rate.  Pulmonary:     Effort: Pulmonary effort is normal. No respiratory distress.  Abdominal:     General: There is no distension.  Musculoskeletal: Normal range of motion.  Skin:    General: Skin is warm and dry.     Comments: Erythematous macular papular rash diffusely across upper extremities, lower extremities, more focal near elbows and knees, lower abdomen as well as on face.  Neurological:     Mental Status: She is alert and oriented to person, place, and time.      UC Treatments / Results  Labs (all labs ordered are listed, but only abnormal results are displayed) Labs Reviewed - No data to display  EKG   Radiology No results found.  Procedures Procedures (  including critical care time)   Medications Ordered in UC Medications - No data to display  Initial Impression / Assessment and Plan / UC Course  I have reviewed the triage vital signs and the nursing notes.  Pertinent labs & imaging results that were available during my care of the patient were reviewed by me and considered in my medical decision making (see chart for details).    Patient previously with relief with prednisone, unclear trigger.  Will repeat prednisone taper given involvement of face, but will only provide 6 days.  Continue antihistamines, discussed to follow-up with OB/GYN if symptoms persisting.  Discussed ideally would not be on prednisone long-term, especially in pregnancy.  Rash does not seem fungal.  Seems inflammatory/allergic reaction.  No systemic symptoms.  Discussed strict return precautions. Patient verbalized understanding and is agreeable with plan.   Final Clinical Impressions(s) / UC Diagnoses   Final diagnoses:  Rash and nonspecific skin eruption  Allergic contact dermatitis, unspecified trigger     Discharge Instructions     We will repeat prednisone taper- 6 tabs today, decrease by 1 tab each day until you take 1 tab on day 6  Daily cetirizine, may supplement with benadryl  Follow up with OBGYN if rash still persisting   ED Prescriptions    Medication Sig Dispense Auth. Provider   predniSONE (STERAPRED UNI-PAK 21 TAB) 10 MG (21) TBPK tablet Take by mouth daily. Take as directed 21 tablet Charlene Detter C, PA-C   triamcinolone cream (KENALOG) 0.1 % Apply 1 application topically 2 (two) times daily. 45 g Davanee Klinkner, Flordell HillsHallie C, PA-C     Controlled Substance Prescriptions [place] Controlled Substance Registry consulted? Not Applicable   Lew DawesWieters, Katelynd Blauvelt C, [place] JerseyPA-C 03/04/19 1442

## 2019-03-06 NOTE — Telephone Encounter (Signed)
Medication was refilled by another provider outside this office.

## 2019-03-14 ENCOUNTER — Encounter: Payer: Self-pay | Admitting: Advanced Practice Midwife

## 2019-03-14 ENCOUNTER — Ambulatory Visit (INDEPENDENT_AMBULATORY_CARE_PROVIDER_SITE_OTHER): Payer: Medicaid Other

## 2019-03-14 ENCOUNTER — Other Ambulatory Visit (HOSPITAL_COMMUNITY)
Admission: RE | Admit: 2019-03-14 | Discharge: 2019-03-14 | Disposition: A | Discharge status: Home / Self Care | Payer: Medicaid Other | Source: Ambulatory Visit | Attending: Advanced Practice Midwife | Admitting: Advanced Practice Midwife

## 2019-03-14 ENCOUNTER — Other Ambulatory Visit: Payer: Self-pay

## 2019-03-14 ENCOUNTER — Ambulatory Visit (INDEPENDENT_AMBULATORY_CARE_PROVIDER_SITE_OTHER): Payer: Medicaid Other | Admitting: Advanced Practice Midwife

## 2019-03-14 VITALS — BP 136/84 | Wt 206.0 lb

## 2019-03-14 DIAGNOSIS — O26893 Other specified pregnancy related conditions, third trimester: Secondary | ICD-10-CM

## 2019-03-14 DIAGNOSIS — Z3A34 34 weeks gestation of pregnancy: Secondary | ICD-10-CM

## 2019-03-14 DIAGNOSIS — Z3480 Encounter for supervision of other normal pregnancy, unspecified trimester: Secondary | ICD-10-CM

## 2019-03-14 DIAGNOSIS — Z363 Encounter for antenatal screening for malformations: Secondary | ICD-10-CM

## 2019-03-14 DIAGNOSIS — Z113 Encounter for screening for infections with a predominantly sexual mode of transmission: Secondary | ICD-10-CM | POA: Insufficient documentation

## 2019-03-14 DIAGNOSIS — Z3483 Encounter for supervision of other normal pregnancy, third trimester: Secondary | ICD-10-CM

## 2019-03-14 DIAGNOSIS — O0933 Supervision of pregnancy with insufficient antenatal care, third trimester: Secondary | ICD-10-CM

## 2019-03-14 DIAGNOSIS — Z6791 Unspecified blood type, Rh negative: Secondary | ICD-10-CM

## 2019-03-14 NOTE — Progress Notes (Signed)
New Obstetric Patient H&P  Date of Service: 03/14/2019  Chief Complaint: "Desires prenatal care"   History of Present Illness: Patient is a 28 y.o. [phone] Not Hispanic or Latino female, presents with amenorrhea and positive home pregnancy test. Patient's last menstrual period was 12/09/2018.and based on physical exam and ultrasound today, her EDD is Estimated Date of Delivery: 04/23/2019 and her EGA is 5341w2d. Cycles are 5. days, irregular, and occur approximately every : 1-2 months. Her last pap smear was 9 months ago and was no abnormalities. She was using birth control pills for contraceptive and having irregular periods until April of this year. Based on ultrasound dating, conception would have been around the third week of December 2019. She claims she only started feeling the fetus about 4 weeks ago and that was also when she noticed her belly getting bigger. This is the same thing that happened with her last pregnancy. We discussed birth control options given her history of pregnancy on the pill. She does not like IUD as she has had a previous negative experience. She is hesitant to have tubal ligation. Her husband is hesitant to have vasectomy. She discontinued her OCP about a month and a half ago.   She had a urine pregnancy test which was positive 4 week(s)  ago. Her last menstrual period was normal and lasted for  5 or 6 day(s). Since her LMP she claims she has experienced nausea. She denies vaginal bleeding. Her past medical history is noncontributory. Her prior pregnancies are notable for 1 SAB and 3 FT SVDs. Her last was the biggest at 8#12oz and was born just 5 days after initiating prenatal care at 38 weeks.  Since her LMP, she admits to the use of tobacco products  no She claims she has gained   15-20 pounds since the start of her pregnancy.  There are cats in the home in the home  no  She admits close contact with children on a regular basis  yes  She has had chicken pox in the past  yes She has had Tuberculosis exposures, symptoms, or previously tested positive for TB   no Current or past history of domestic violence. no  Genetic Screening/Teratology Counseling: (Includes patient, baby's father, or anyone in either family with:)   1. [facility]/= 2835 [facility]  no 2. Thalassemia ([place], [place], Mediterranean, or Asian background): MCV<80  no 3. Neural tube defect (meningomyelocele, spina bifida, anencephaly)  no 4. Congenital heart defect  no  5. Down syndrome  no 6. Tay-Sachs (Jewish, [place])  no 7. Canavan's Disease  no 8. Sickle cell disease or trait (African)  no  9. Hemophilia or other blood disorders  no  10. Muscular dystrophy  no  11. Cystic fibrosis  no  12. Huntington's Chorea  no  13. Mental retardation/autism  no 14. Other inherited genetic or chromosomal disorder  no 15. Maternal metabolic disorder (DM, PKU, etc)  no 16. Patient or FOB with a child with a birth defect not listed above no  16a. Patient or FOB with a birth defect themselves no 17. Recurrent pregnancy loss, or stillbirth  no  18. Any medications since LMP other than prenatal vitamins (include vitamins, supplements, OTC meds, drugs, alcohol)  no 19. Any other genetic/environmental exposure to discuss  no  Infection History:   1. Lives with someone with TB or TB exposed  no  2. Patient or partner has history of genital herpes  no 3. Rash or viral  illness since LMP  no 4. History of STI (GC, CT, HPV, syphilis, HIV)  no 5. History of recent travel :  no  Other pertinent information:  no     Review of Systems:10 point review of systems negative unless otherwise noted in HPI  Past Medical History:  Past Medical History:  Diagnosis Date  . Anemia   . Chronic headaches   . Miscarriage   . Rh negative state in antepartum period, third trimester     Past Surgical History:  Past Surgical History:  Procedure Laterality Date  . ARM SURGERY  2007   RIGHT ELBOW  .  VAGINAL DELIVERY     x3    Gynecologic History: Patient's last menstrual period was 12/09/2018.  Obstetric History: [id number]  Family History:  Family History  Problem Relation Age of Onset  . Hypertension Father   . Hyperlipidemia Father   . Diabetes Paternal Aunt        type 2  . Diabetes Paternal Grandmother        type2  . Thyroid disease Cousin   . Hyperlipidemia Maternal Grandmother   . Heart disease Maternal Grandmother   . Heart disease Maternal Grandfather   . Hyperlipidemia Maternal Grandfather   . Stroke Maternal Grandfather     Social History:  Social History   Socioeconomic History  . Marital status: Married    Spouse name: Not on file  . Number of children: 4  . Years of education: 16  . Highest education level: Not on file  Occupational History  . Occupation: stay at home mom  . 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 Financial resource strain: Not on file  . Food insecurity    Worry: Not on file    Inability: Not on file  . Transportation needs    Medical: Not on file    Non-medical: Not on file  Tobacco Use  . Smoking status: Former Smoker    Packs/day: 0.25    Years: 2.00    Pack years: 0.50    Quit date: 04/21/2012    Years since quitting: 6.8  . Smokeless tobacco: Never Used  Substance and Sexual Activity  . Alcohol use: No    Comment: occasionally-half a glass a week  . Drug use: No  . Sexual activity: Yes    Partners: Male    Birth control/protection: None    Comment: with monogamous husband  Lifestyle  . Physical activity    Days per week: Not on file    Minutes per session: Not on file  . Stress: Not on file  Relationships  . Social Herbalist on phone: Not on file    Gets together: Not on file    Attends religious service: Not on file    Active member of club or organization: Not on file    Attends meetings of clubs or organizations: Not on file    Relationship status: Not on  file  . Intimate partner violence    Fear of current or ex partner: Not on file    Emotionally abused: Not on file    Physically abused: Not on file    Forced sexual activity: Not on file  Other Topics Concern  . Not on file  Social History Narrative  . Not on file    Allergies:  Allergies  Allergen Reactions  . Salicylic Acid Rash    Acne Medications  . Other  SKIN ALLERGY-ACNE RX; salycylic acid  . Penicillin G Swelling    Medications: Prior to Admission medications   Medication Sig Start Date End Date Taking? Authorizing Provider  acetaminophen (TYLENOL) 325 MG tablet Take 650 mg by mouth every 6 (six) hours as needed for mild pain.    Yes [provider]  Prenatal Vit-Fe Fumarate-FA (MULTIVITAMIN-PRENATAL) 27-0.8 MG TABS tablet Take 1 tablet by mouth daily at 12 noon.   Yes [provider]    Physical Exam Vitals: Blood pressure 136/84, weight 206 lb (93.4 kg), last menstrual period 12/09/2018  General: NAD HEENT: normocephalic, anicteric Thyroid: no enlargement, no palpable nodules Pulmonary: No increased work of breathing, CTAB Cardiovascular: RRR, distal pulses 2+ Abdomen: NABS, soft, non-tender, non-distended.  Umbilicus without lesions.  No hepatomegaly, splenomegaly or masses palpable. No evidence of hernia, FHTs: 140s, FH: 31 cm Genitourinary: deferred for no concerns/advanced pregnancy/PAP interval Extremities: no edema, erythema, or tenderness Neurologic: Grossly intact Psychiatric: mood appropriate, affect full  See results of today's ultrasound below  Assessment: 28 y.o. [id number] at 6848w2d by today's ultrasound presenting to initiate prenatal care  Plan: 1) Avoid alcoholic beverages. 2) Patient encouraged not to smoke.  3) Discontinue the use of all non-medicinal drugs and chemicals.  4) Take prenatal vitamins daily.  5) Nutrition, food safety (fish, cheese advisories, and high nitrite foods) and exercise discussed. 6) Hospital  and practice style discussed with cross coverage system.  7) Genetic Screening, such as with 1st Trimester Screening, cell free fetal DNA, AFP testing, and Ultrasound, as well as with amniocentesis and CVS as appropriate, is discussed with patient. At the conclusion of today's visit patient declined genetic testing 8) Patient is asked about travel to areas at risk for the [place] virus, and counseled to avoid travel and exposure to mosquitoes or sexual partners who may have themselves been exposed to the virus. Testing is discussed, and will be ordered as appropriate.  9) Return in 1 week for NOB labs including 1 hr gtt, f/u anatomy and rob   Tresea MallJane Summer Parthasarathy, CNM [facility] 03/14/2019, 3:40 PM   Patient Name: Anola GurneyHayden D Oommen DOB: 15-Jan-1991 MRN: [id number]  ULTRASOUND REPORT  Location: [facility] Date of Service: 03/14/2019   Indications:Anatomy Ultrasound and dating Findings:  Singleton intrauterine pregnancy is visualized with FHR at 130 BPM. Biometrics give an (U/S) Gestational age of 8448w2d and an (U/S) EDD of 04/23/2019; this does not correlate with the clinically established Estimated Date of Delivery: 09/15/19  Fetal presentation is Cephalic.  EFW: 2488 g  (5 lb 8 oz ). Placenta: anterior. Grade: 1 AFI: subjectively normal.  Anatomic survey is incomplete for lumbar spine, LVOT, DA and normal; Gender - female.    Impression: 1. 9748w2d Viable Singleton Intrauterine pregnancy by U/S. 2. (U/S) EDD is not consistent with Clinically established Estimated Date of Delivery: 09/15/19 . 3. Incomplete anatomy scan for anatomy mentioned above.   Recommendations: 1.Clinical correlation with the patient's History and Physical Exam.  Deanna ArtisElyse S Fairbanks, RT

## 2019-03-14 NOTE — Progress Notes (Signed)
NOB today. No vb. No lof. LMP "end of April"

## 2019-03-15 DIAGNOSIS — Z3480 Encounter for supervision of other normal pregnancy, unspecified trimester: Secondary | ICD-10-CM | POA: Diagnosis not present

## 2019-03-16 LAB — CERVICOVAGINAL ANCILLARY ONLY
Chlamydia: NEGATIVE
Neisseria Gonorrhea: NEGATIVE
Trichomonas: NEGATIVE

## 2019-03-17 LAB — URINE CULTURE

## 2019-03-18 ENCOUNTER — Other Ambulatory Visit: Payer: Self-pay | Admitting: Advanced Practice Midwife

## 2019-03-18 DIAGNOSIS — O2343 Unspecified infection of urinary tract in pregnancy, third trimester: Secondary | ICD-10-CM

## 2019-03-18 MED ORDER — NITROFURANTOIN MONOHYD MACRO 100 MG PO CAPS: 100.0000 mg | ORAL_CAPSULE | Freq: Two times a day (BID) | ORAL | 0 refills | Status: DC

## 2019-03-18 NOTE — Progress Notes (Unsigned)
Rx macrobid sent to patient pharmacy for e faecalis uti

## 2019-03-23 ENCOUNTER — Ambulatory Visit (INDEPENDENT_AMBULATORY_CARE_PROVIDER_SITE_OTHER): Payer: Medicaid Other

## 2019-03-23 ENCOUNTER — Other Ambulatory Visit: Payer: Medicaid Other

## 2019-03-23 ENCOUNTER — Other Ambulatory Visit: Payer: Self-pay

## 2019-03-23 ENCOUNTER — Ambulatory Visit (INDEPENDENT_AMBULATORY_CARE_PROVIDER_SITE_OTHER): Payer: Medicaid Other | Admitting: Maternal Newborn

## 2019-03-23 ENCOUNTER — Other Ambulatory Visit: Payer: Self-pay | Admitting: Advanced Practice Midwife

## 2019-03-23 ENCOUNTER — Encounter: Payer: Self-pay | Admitting: Maternal Newborn

## 2019-03-23 VITALS — BP 120/80 | Wt 206.0 lb

## 2019-03-23 DIAGNOSIS — Z3480 Encounter for supervision of other normal pregnancy, unspecified trimester: Secondary | ICD-10-CM

## 2019-03-23 DIAGNOSIS — O26892 Other specified pregnancy related conditions, second trimester: Secondary | ICD-10-CM | POA: Diagnosis not present

## 2019-03-23 DIAGNOSIS — Z113 Encounter for screening for infections with a predominantly sexual mode of transmission: Secondary | ICD-10-CM | POA: Diagnosis not present

## 2019-03-23 DIAGNOSIS — Z362 Encounter for other antenatal screening follow-up: Secondary | ICD-10-CM

## 2019-03-23 DIAGNOSIS — Z6791 Unspecified blood type, Rh negative: Secondary | ICD-10-CM

## 2019-03-23 DIAGNOSIS — Z3A35 35 weeks gestation of pregnancy: Secondary | ICD-10-CM | POA: Diagnosis not present

## 2019-03-23 MED ORDER — RHO D IMMUNE GLOBULIN 1500 UNIT/2ML IJ SOSY
300.0000 ug | PREFILLED_SYRINGE | Freq: Once | INTRAMUSCULAR | Status: AC
  Administered 2019-03-23: 13:00:00 300 ug via INTRAMUSCULAR

## 2019-03-23 NOTE — Patient Instructions (Signed)
Third Trimester of Pregnancy The third trimester is from week 28 through week 40 (months 7 through 9). The third trimester is a time when the unborn baby (fetus) is growing rapidly. At the end of the ninth month, the fetus is about 20 inches in length and weighs 6-10 pounds. Body changes during your third trimester Your body will continue to go through many changes during pregnancy. The changes vary from woman to woman. During the third trimester:  Your weight will continue to increase. You can expect to gain 25-35 pounds (11-16 kg) by the end of the pregnancy.  You may begin to get stretch marks on your hips, abdomen, and breasts.  You may urinate more often because the fetus is moving lower into your pelvis and pressing on your bladder.  You may develop or continue to have heartburn. This is caused by increased hormones that slow down muscles in the digestive tract.  You may develop or continue to have constipation because increased hormones slow digestion and cause the muscles that push waste through your intestines to relax.  You may develop hemorrhoids. These are swollen veins (varicose veins) in the rectum that can itch or be painful.  You may develop swollen, bulging veins (varicose veins) in your legs.  You may have increased body aches in the pelvis, back, or thighs. This is due to weight gain and increased hormones that are relaxing your joints.  You may have changes in your hair. These can include thickening of your hair, rapid growth, and changes in texture. Some women also have hair loss during or after pregnancy, or hair that feels dry or thin. Your hair will most likely return to normal after your baby is born.  Your breasts will continue to grow and they will continue to become tender. A yellow fluid (colostrum) may leak from your breasts. This is the first milk you are producing for your baby.  Your belly button may stick out.  You may notice more swelling in your hands,  face, or ankles.  You may have increased tingling or numbness in your hands, arms, and legs. The skin on your belly may also feel numb.  You may feel short of breath because of your expanding uterus.  You may have more problems sleeping. This can be caused by the size of your belly, increased need to urinate, and an increase in your body's metabolism.  You may notice the fetus "dropping," or moving lower in your abdomen (lightening).  You may have increased vaginal discharge.  You may notice your joints feel loose and you may have pain around your pelvic bone. What to expect at prenatal visits You will have prenatal exams every 2 weeks until week 36. Then you will have weekly prenatal exams. During a routine prenatal visit:  You will be weighed to make sure you and the baby are growing normally.  Your blood pressure will be taken.  Your abdomen will be measured to track your baby's growth.  The fetal heartbeat will be listened to.  Any test results from the previous visit will be discussed.  You may have a cervical check near your due date to see if your cervix has softened or thinned (effaced).  You will be tested for Group B streptococcus. This happens between 35 and 37 weeks. Your health care provider may ask you:  What your birth plan is.  How you are feeling.  If you are feeling the baby move.  If you have had any abnormal   symptoms, such as leaking fluid, bleeding, severe headaches, or abdominal cramping.  If you are using any tobacco products, including cigarettes, chewing tobacco, and electronic cigarettes.  If you have any questions. Other tests or screenings that may be performed during your third trimester include:  Blood tests that check for low iron levels (anemia).  Fetal testing to check the health, activity level, and growth of the fetus. Testing is done if you have certain medical conditions or if there are problems during the pregnancy.  Nonstress test  (NST). This test checks the health of your baby to make sure there are no signs of problems, such as the baby not getting enough oxygen. During this test, a belt is placed around your belly. The baby is made to move, and its heart rate is monitored during movement. What is false labor? False labor is a condition in which you feel small, irregular tightenings of the muscles in the womb (contractions) that usually go away with rest, changing position, or drinking water. These are called Braxton Hicks contractions. Contractions may last for hours, days, or even weeks before true labor sets in. If contractions come at regular intervals, become more frequent, increase in intensity, or become painful, you should see your health care provider. What are the signs of labor?  Abdominal cramps.  Regular contractions that start at 10 minutes apart and become stronger and more frequent with time.  Contractions that start on the top of the uterus and spread down to the lower abdomen and back.  Increased pelvic pressure and dull back pain.  A watery or bloody mucus discharge that comes from the vagina.  Leaking of amniotic fluid. This is also known as your "water breaking." It could be a slow trickle or a gush. Let your health care provider know if it has a color or strange odor. If you have any of these signs, call your health care provider right away, even if it is before your due date. Follow these instructions at home: Medicines  Follow your health care provider's instructions regarding medicine use. Specific medicines may be either safe or unsafe to take during pregnancy.  Take a prenatal vitamin that contains at least 600 micrograms (mcg) of folic acid.  If you develop constipation, try taking a stool softener if your health care provider approves. Eating and drinking   Eat a balanced diet that includes fresh fruits and vegetables, whole grains, good sources of protein such as meat, eggs, or tofu,  and low-fat dairy. Your health care provider will help you determine the amount of weight gain that is right for you.  Avoid raw meat and uncooked cheese. These carry germs that can cause birth defects in the baby.  If you have low calcium intake from food, talk to your health care provider about whether you should take a daily calcium supplement.  Eat four or five small meals rather than three large meals a day.  Limit foods that are high in fat and processed sugars, such as fried and sweet foods.  To prevent constipation: ? Drink enough fluid to keep your urine clear or pale yellow. ? Eat foods that are high in fiber, such as fresh fruits and vegetables, whole grains, and beans. Activity  Exercise only as directed by your health care provider. Most women can continue their usual exercise routine during pregnancy. Try to exercise for 30 minutes at least 5 days a week. Stop exercising if you experience uterine contractions.  Avoid heavy lifting.  Do   not exercise in extreme heat or humidity, or at high altitudes.  Wear low-heel, comfortable shoes.  Practice good posture.  You may continue to have sex unless your health care provider tells you otherwise. Relieving pain and discomfort  Take frequent breaks and rest with your legs elevated if you have leg cramps or low back pain.  Take warm sitz baths to soothe any pain or discomfort caused by hemorrhoids. Use hemorrhoid cream if your health care provider approves.  Wear a good support bra to prevent discomfort from breast tenderness.  If you develop varicose veins: ? Wear support pantyhose or compression stockings as told by your healthcare provider. ? Elevate your feet for 15 minutes, 3-4 times a day. Prenatal care  Write down your questions. Take them to your prenatal visits.  Keep all your prenatal visits as told by your health care provider. This is important. Safety  Wear your seat belt at all times when driving.  Make  a list of emergency phone numbers, including numbers for family, friends, the hospital, and police and fire departments. General instructions  Avoid cat litter boxes and soil used by cats. These carry germs that can cause birth defects in the baby. If you have a cat, ask someone to clean the litter box for you.  Do not travel far distances unless it is absolutely necessary and only with the approval of your health care provider.  Do not use hot tubs, steam rooms, or saunas.  Do not drink alcohol.  Do not use any products that contain nicotine or tobacco, such as cigarettes and e-cigarettes. If you need help quitting, ask your health care provider.  Do not use any medicinal herbs or unprescribed drugs. These chemicals affect the formation and growth of the baby.  Do not douche or use tampons or scented sanitary pads.  Do not cross your legs for long periods of time.  To prepare for the arrival of your baby: ? Take prenatal classes to understand, practice, and ask questions about labor and delivery. ? Make a trial run to the hospital. ? Visit the hospital and tour the maternity area. ? Arrange for maternity or paternity leave through employers. ? Arrange for family and friends to take care of pets while you are in the hospital. ? Purchase a rear-facing car seat and make sure you know how to install it in your car. ? Pack your hospital bag. ? Prepare the baby's nursery. Make sure to remove all pillows and stuffed animals from the baby's crib to prevent suffocation.  Visit your dentist if you have not gone during your pregnancy. Use a soft toothbrush to brush your teeth and be gentle when you floss. Contact a health care provider if:  You are unsure if you are in labor or if your water has broken.  You become dizzy.  You have mild pelvic cramps, pelvic pressure, or nagging pain in your abdominal area.  You have lower back pain.  You have persistent nausea, vomiting, or diarrhea.   You have an unusual or bad smelling vaginal discharge.  You have pain when you urinate. Get help right away if:  Your water breaks before 37 weeks.  You have regular contractions less than 5 minutes apart before 37 weeks.  You have a fever.  You are leaking fluid from your vagina.  You have spotting or bleeding from your vagina.  You have severe abdominal pain or cramping.  You have rapid weight loss or weight gain.  You have   shortness of breath with chest pain.  You notice sudden or extreme swelling of your face, hands, ankles, feet, or legs.  Your baby makes fewer than 10 movements in 2 hours.  You have severe headaches that do not go away when you take medicine.  You have vision changes. Summary  The third trimester is from week 28 through week 40, months 7 through 9. The third trimester is a time when the unborn baby (fetus) is growing rapidly.  During the third trimester, your discomfort may increase as you and your baby continue to gain weight. You may have abdominal, leg, and back pain, sleeping problems, and an increased need to urinate.  During the third trimester your breasts will keep growing and they will continue to become tender. A yellow fluid (colostrum) may leak from your breasts. This is the first milk you are producing for your baby.  False labor is a condition in which you feel small, irregular tightenings of the muscles in the womb (contractions) that eventually go away. These are called Braxton Hicks contractions. Contractions may last for hours, days, or even weeks before true labor sets in.  Signs of labor can include: abdominal cramps; regular contractions that start at 10 minutes apart and become stronger and more frequent with time; watery or bloody mucus discharge that comes from the vagina; increased pelvic pressure and dull back pain; and leaking of amniotic fluid. This information is not intended to replace advice given to you by your health  care provider. Make sure you discuss any questions you have with your health care provider. Document Released: 07/22/2001 Document Revised: 11/18/2018 Document Reviewed: 09/02/2016 Elsevier Patient Education  2020 [employer] Inc[employer]  

## 2019-03-23 NOTE — Progress Notes (Signed)
    Routine Prenatal Care Visit  Subjective  Nancy Grimes is a 28 y.o. [phone] at [redacted]w[redacted]d being seen today for ongoing prenatal care.  She is currently monitored for the following issues for this low-risk pregnancy and has Common migraine; Obesity (BMI 30.0-34.9); Allergic contact dermatitis due to metals; Missed menses; and Supervision of other normal pregnancy, antepartum on their problem list.  ----------------------------------------------------------------------------------- Patient reports no complaints.   Contractions: Not present. Vag. Bleeding: None.  Movement: Present. No leaking of fluid.  ----------------------------------------------------------------------------------- The following portions of the patient's history were reviewed and updated as appropriate: allergies, current medications, past family history, past medical history, past social history, past surgical history and problem list. Problem list updated.   Objective  Blood pressure 120/80, weight 206 lb (93.4 kg), last menstrual period 12/09/2018 Pregravid weight Pregravid weight not on file Total Weight Gain Not found.   Fetal Status: Fetal Heart Rate (bpm): 153 ([place])   Movement: Present     General:  Alert, oriented and cooperative. Patient is in no acute distress.  Skin: Skin is warm and dry. No rash noted.   Cardiovascular: Normal heart rate noted  Respiratory: Normal respiratory effort, no problems with respiration noted  Abdomen: Soft, gravid, appropriate for gestational age. Pain/Pressure: Present     Pelvic:  Cervical exam deferred        Extremities: Normal range of motion.  Edema: None  Mental Status: Normal mood and affect. Normal behavior. Normal judgment and thought content.     Assessment   28 y.o. [id number] at [redacted]w[redacted]d, EDD 04/23/2019 by Ultrasound presenting for a routine prenatal visit.  Plan   pregnancy Problems (from 03/14/19 to present)    No problems associated with this episode.       Follow up anatomy scan today: DA, LVOT, and lumbar spine were seen, normal anatomy scan is complete. Results reviewed with patient.  GTT/labs and RhoGAM today.  Please refer to After Visit Summary for other counseling recommendations.   Return in about 1 week (around 03/30/2019) for [place].  Avel Sensor, CNM 03/23/2019  11:58 AM

## 2019-03-25 LAB — RPR+RH+ABO+RUB AB+AB SCR+CB...
Antibody Screen: NEGATIVE
HIV Screen 4th Generation wRfx: NONREACTIVE
Hematocrit: 28.9 % — ABNORMAL LOW (ref 34.0–46.6)
Hemoglobin: 9.6 g/dL — ABNORMAL LOW (ref 11.1–15.9)
Hepatitis B Surface Ag: NEGATIVE
MCH: 27.3 pg (ref 26.6–33.0)
MCHC: 33.2 g/dL (ref 31.5–35.7)
MCV: 82 fL (ref 79–97)
Platelets: 231 10*3/uL (ref 150–450)
RBC: 3.52 x10E6/uL — ABNORMAL LOW (ref 3.77–5.28)
RDW: 15.2 % (ref 11.7–15.4)
RPR Ser Ql: NONREACTIVE
Rh Factor: NEGATIVE
Rubella Antibodies, IGG: 1.82 index (ref >0.99)
Varicella zoster IgG: 387 index (ref >165)
WBC: 9.7 10*3/uL (ref 3.4–10.8)

## 2019-03-25 LAB — GLUCOSE, 1 HOUR GESTATIONAL: Gestational Diabetes Screen: 121 mg/dL (ref 65–139)

## 2019-03-30 ENCOUNTER — Ambulatory Visit (INDEPENDENT_AMBULATORY_CARE_PROVIDER_SITE_OTHER): Payer: Medicaid Other | Admitting: Advanced Practice Midwife

## 2019-03-30 ENCOUNTER — Encounter: Payer: Self-pay | Admitting: Advanced Practice Midwife

## 2019-03-30 ENCOUNTER — Other Ambulatory Visit: Payer: Self-pay

## 2019-03-30 VITALS — BP 122/74 | Wt 205.0 lb

## 2019-03-30 DIAGNOSIS — Z3685 Encounter for antenatal screening for Streptococcus B: Secondary | ICD-10-CM | POA: Diagnosis not present

## 2019-03-30 DIAGNOSIS — Z3483 Encounter for supervision of other normal pregnancy, third trimester: Secondary | ICD-10-CM

## 2019-03-30 DIAGNOSIS — Z3A36 36 weeks gestation of pregnancy: Secondary | ICD-10-CM

## 2019-03-30 NOTE — Progress Notes (Signed)
  Routine Prenatal Care Visit  Subjective  Nancy Grimes is a 28 y.o. [phone] at [redacted]w[redacted]d being seen today for ongoing prenatal care.  She is currently monitored for the following issues for this low-risk pregnancy and has Common migraine; Obesity (BMI 30.0-34.9); Allergic contact dermatitis due to metals; Missed menses; and Supervision of other normal pregnancy, antepartum on their problem list.  ----------------------------------------------------------------------------------- Patient reports no complaints.   Contractions: Not present. Vag. Bleeding: None.  Movement: Present. Denies leaking of fluid.  ----------------------------------------------------------------------------------- The following portions of the patient's history were reviewed and updated as appropriate: allergies, current medications, past family history, past medical history, past social history, past surgical history and problem list. Problem list updated.   Objective  Blood pressure 122/74, weight 205 lb (93 kg), last menstrual period 12/09/2018, unknown if currently breastfeeding. Pregravid weight 202 lb (91.6 kg) Total Weight Gain 3 lb (1.361 kg) Urinalysis: Urine Protein    Urine Glucose    Fetal Status: Fetal Heart Rate (bpm): 152 Fundal Height: 37 cm Movement: Present     General:  Alert, oriented and cooperative. Patient is in no acute distress.  Skin: Skin is warm and dry. No rash noted.   Cardiovascular: Normal heart rate noted  Respiratory: Normal respiratory effort, no problems with respiration noted  Abdomen: Soft, gravid, appropriate for gestational age. Pain/Pressure: Absent     Pelvic:  Cervical exam deferred      GBS collected  Extremities: Normal range of motion.  Edema: None  Mental Status: Normal mood and affect. Normal behavior. Normal judgment and thought content.   Assessment   28 y.o. [id number] at [redacted]w[redacted]d by  04/23/2019, by Ultrasound presenting for routine prenatal visit  Plan   pregnancy  Problems (from 03/14/19 to present)    No problems associated with this episode.       Preterm labor symptoms and general obstetric precautions including but not limited to vaginal bleeding, contractions, leaking of fluid and fetal movement were reviewed in detail with the patient. Please refer to After Visit Summary for other counseling recommendations.   Return in about 1 week (around 04/06/2019) for rob.  Rod Can, CNM 03/30/2019 11:42 AM

## 2019-03-30 NOTE — Progress Notes (Signed)
No vb. No lof.  

## 2019-03-30 NOTE — Patient Instructions (Signed)
Braxton Hicks Contractions Contractions of the uterus can occur throughout pregnancy, but they are not always a sign that you are in labor. You may have practice contractions called Braxton Hicks contractions. These false labor contractions are sometimes confused with true labor. What are Braxton Hicks contractions? Braxton Hicks contractions are tightening movements that occur in the muscles of the uterus before labor. Unlike true labor contractions, these contractions do not result in opening (dilation) and thinning of the cervix. Toward the end of pregnancy (32-34 weeks), Braxton Hicks contractions can happen more often and may become stronger. These contractions are sometimes difficult to tell apart from true labor because they can be very uncomfortable. You should not feel embarrassed if you go to the hospital with false labor. Sometimes, the only way to tell if you are in true labor is for your health care provider to look for changes in the cervix. The health care provider will do a physical exam and may monitor your contractions. If you are not in true labor, the exam should show that your cervix is not dilating and your water has not broken. If there are no other health problems associated with your pregnancy, it is completely safe for you to be sent home with false labor. You may continue to have Braxton Hicks contractions until you go into true labor. How to tell the difference between true labor and false labor True labor  Contractions last 30-70 seconds.  Contractions become very regular.  Discomfort is usually felt in the top of the uterus, and it spreads to the lower abdomen and low back.  Contractions do not go away with walking.  Contractions usually become more intense and increase in frequency.  The cervix dilates and gets thinner. False labor  Contractions are usually shorter and not as strong as true labor contractions.  Contractions are usually irregular.  Contractions  are often felt in the front of the lower abdomen and in the groin.  Contractions may go away when you walk around or change positions while lying down.  Contractions get weaker and are shorter-lasting as time goes on.  The cervix usually does not dilate or become thin. Follow these instructions at home:   Take over-the-counter and prescription medicines only as told by your health care provider.  Keep up with your usual exercises and follow other instructions from your health care provider.  Eat and drink lightly if you think you are going into labor.  If Braxton Hicks contractions are making you uncomfortable: ? Change your position from lying down or resting to walking, or change from walking to resting. ? Sit and rest in a tub of warm water. ? Drink enough fluid to keep your urine pale yellow. Dehydration may cause these contractions. ? Do slow and deep breathing several times an hour.  Keep all follow-up prenatal visits as told by your health care provider. This is important. Contact a health care provider if:  You have a fever.  You have continuous pain in your abdomen. Get help right away if:  Your contractions become stronger, more regular, and closer together.  You have fluid leaking or gushing from your vagina.  You pass blood-tinged mucus (bloody show).  You have bleeding from your vagina.  You have low back pain that you never had before.  You feel your baby's head pushing down and causing pelvic pressure.  Your baby is not moving inside you as much as it used to. Summary  Contractions that occur before labor are   called Braxton Hicks contractions, false labor, or practice contractions.  Braxton Hicks contractions are usually shorter, weaker, farther apart, and less regular than true labor contractions. True labor contractions usually become progressively stronger and regular, and they become more frequent.  Manage discomfort from Braxton Hicks contractions  by changing position, resting in a warm bath, drinking plenty of water, or practicing deep breathing. This information is not intended to replace advice given to you by your health care provider. Make sure you discuss any questions you have with your health care provider. Document Released: 12/11/2016 Document Revised: 07/10/2017 Document Reviewed: 12/11/2016 Elsevier Patient Education  2020 [employer]  

## 2019-04-04 LAB — STREP GP B SUSCEPTIBILITY

## 2019-04-04 LAB — STREP GP B CULTURE+RFLX: Strep Gp B Culture+Rflx: POSITIVE — AB

## 2019-04-06 ENCOUNTER — Ambulatory Visit (INDEPENDENT_AMBULATORY_CARE_PROVIDER_SITE_OTHER): Payer: Medicaid Other | Admitting: Obstetrics & Gynecology

## 2019-04-06 ENCOUNTER — Other Ambulatory Visit: Payer: Self-pay

## 2019-04-06 ENCOUNTER — Encounter: Payer: Self-pay | Admitting: Obstetrics & Gynecology

## 2019-04-06 VITALS — BP 120/80 | Wt 207.0 lb

## 2019-04-06 DIAGNOSIS — Z3A37 37 weeks gestation of pregnancy: Secondary | ICD-10-CM

## 2019-04-06 DIAGNOSIS — Z3483 Encounter for supervision of other normal pregnancy, third trimester: Secondary | ICD-10-CM

## 2019-04-06 DIAGNOSIS — Z3480 Encounter for supervision of other normal pregnancy, unspecified trimester: Secondary | ICD-10-CM

## 2019-04-06 NOTE — Progress Notes (Signed)
  Subjective  Fetal Movement? yes Contractions? no Leaking Fluid? no Vaginal Bleeding? no  Objective  BP 120/80   Wt 207 lb (93.9 kg)   LMP 12/09/2018 (Approximate)   BMI 35.53 kg/m  General: NAD Pumonary: no increased work of breathing Abdomen: gravid, non-tender Extremities: no edema Psychiatric: mood appropriate, affect full  Assessment  28 y.o. [id number] at [redacted]w[redacted]d by  04/23/2019, by Ultrasound presenting for routine prenatal visit  Plan   Problem List Items Addressed This Visit      Other   Supervision of other normal pregnancy, antepartum    Other Visit Diagnoses    [redacted] weeks gestation of pregnancy    -  Primary     Clinic [facility] Prenatal Labs  Dating LMP Blood type: A/Negative/-- (08/12 1206)   Genetic Screen declined Antibody:Negative (08/12 1206)  Anatomic [place] [facility] Rubella: 1.82 (08/12 1206) Varicella:Im  GTT   Third trimester:nml  RPR: Non Reactive (08/12 1206)   Rhogam given HBsAg: Negative (08/12 1206)   TDaP vaccine No   Flu Shot:declines HIV: Non Reactive (08/12 1206)   Baby Food  Breast GBS: +  Contraception  Prior OCP use (got pregnant) Pap:06/2018  CBB  no   CS/VBAC n/a   Support Person husband     PNV, [place], Labor precautions  Barnett Applebaum, MD, [facility], [facility] 04/06/2019  11:26 AM

## 2019-04-06 NOTE — Patient Instructions (Signed)
Contraception Choices Contraception, also called birth control, refers to methods or devices that prevent pregnancy. Hormonal methods Contraceptive implant  A contraceptive implant is a thin, plastic tube that contains a hormone. It is inserted into the upper part of the arm. It can remain in place for up to 3 years. Progestin-only injections Progestin-only injections are injections of progestin, a synthetic form of the hormone progesterone. They are given every 3 months by a health care provider. Birth control pills  Birth control pills are pills that contain hormones that prevent pregnancy. They must be taken once a day, preferably at the same time each day. Birth control patch  The birth control patch contains hormones that prevent pregnancy. It is placed on the skin and must be changed once a week for three weeks and removed on the fourth week. A prescription is needed to use this method of contraception. Vaginal ring  A vaginal ring contains hormones that prevent pregnancy. It is placed in the vagina for three weeks and removed on the fourth week. After that, the process is repeated with a new ring. A prescription is needed to use this method of contraception. Emergency contraceptive Emergency contraceptives prevent pregnancy after unprotected sex. They come in pill form and can be taken up to 5 days after sex. They work best the sooner they are taken after having sex. Most emergency contraceptives are available without a prescription. This method should not be used as your only form of birth control. Barrier methods Female condom  A female condom is a thin sheath that is worn over the penis during sex. Condoms keep sperm from going inside a woman's body. They can be used with a spermicide to increase their effectiveness. They should be disposed after a single use. Female condom  A female condom is a soft, loose-fitting sheath that is put into the vagina before sex. The condom keeps sperm  from going inside a woman's body. They should be disposed after a single use. Diaphragm  A diaphragm is a soft, dome-shaped barrier. It is inserted into the vagina before sex, along with a spermicide. The diaphragm blocks sperm from entering the uterus, and the spermicide kills sperm. A diaphragm should be left in the vagina for 6-8 hours after sex and removed within 24 hours. A diaphragm is prescribed and fitted by a health care provider. A diaphragm should be replaced every 1-2 years, after giving birth, after gaining more than 15 lb (6.8 kg), and after pelvic surgery. Cervical cap  A cervical cap is a round, soft latex or plastic cup that fits over the cervix. It is inserted into the vagina before sex, along with spermicide. It blocks sperm from entering the uterus. The cap should be left in place for 6-8 hours after sex and removed within 48 hours. A cervical cap must be prescribed and fitted by a health care provider. It should be replaced every 2 years. Sponge  A sponge is a soft, circular piece of polyurethane foam with spermicide on it. The sponge helps block sperm from entering the uterus, and the spermicide kills sperm. To use it, you make it wet and then insert it into the vagina. It should be inserted before sex, left in for at least 6 hours after sex, and removed and thrown away within 30 hours. Spermicides Spermicides are chemicals that kill or block sperm from entering the cervix and uterus. They can come as a cream, jelly, suppository, foam, or tablet. A spermicide should be inserted into the   vagina with an applicator at least 10-15 minutes before sex to allow time for it to work. The process must be repeated every time you have sex. Spermicides do not require a prescription. Intrauterine contraception Intrauterine device (IUD) An IUD is a T-shaped device that is put in a woman's uterus. There are two types:  Hormone IUD.This type contains progestin, a synthetic form of the hormone  progesterone. This type can stay in place for 3-5 years.  Copper IUD.This type is wrapped in copper wire. It can stay in place for 10 years.  Permanent methods of contraception Female tubal ligation In this method, a woman's fallopian tubes are sealed, tied, or blocked during surgery to prevent eggs from traveling to the uterus. Hysteroscopic sterilization In this method, a small, flexible insert is placed into each fallopian tube. The inserts cause scar tissue to form in the fallopian tubes and block them, so sperm cannot reach an egg. The procedure takes about 3 months to be effective. Another form of birth control must be used during those 3 months. Female sterilization This is a procedure to tie off the tubes that carry sperm (vasectomy). After the procedure, the man can still ejaculate fluid (semen). Natural planning methods Natural family planning In this method, a couple does not have sex on days when the woman could become pregnant. Calendar method This means keeping track of the length of each menstrual cycle, identifying the days when pregnancy can happen, and not having sex on those days. Ovulation method In this method, a couple avoids sex during ovulation. Symptothermal method This method involves not having sex during ovulation. The woman typically checks for ovulation by watching changes in her temperature and in the consistency of cervical mucus. Post-ovulation method In this method, a couple waits to have sex until after ovulation. Summary  Contraception, also called birth control, means methods or devices that prevent pregnancy.  Hormonal methods of contraception include implants, injections, pills, patches, vaginal rings, and emergency contraceptives.  Barrier methods of contraception can include female condoms, female condoms, diaphragms, cervical caps, sponges, and spermicides.  There are two types of IUDs (intrauterine devices). An IUD can be put in a woman's uterus to  prevent pregnancy for 3-5 years.  Permanent sterilization can be done through a procedure for males, females, or both.  Natural family planning methods involve not having sex on days when the woman could become pregnant. This information is not intended to replace advice given to you by your health care provider. Make sure you discuss any questions you have with your health care provider. Document Released: 07/28/2005 Document Revised: 07/30/2017 Document Reviewed: 08/30/2016 Elsevier Patient Education  2020 Elsevier Inc.  

## 2019-04-15 ENCOUNTER — Encounter: Payer: Self-pay | Admitting: Advanced Practice Midwife

## 2019-04-15 ENCOUNTER — Other Ambulatory Visit: Payer: Self-pay

## 2019-04-15 ENCOUNTER — Ambulatory Visit (INDEPENDENT_AMBULATORY_CARE_PROVIDER_SITE_OTHER): Payer: Medicaid Other | Admitting: Advanced Practice Midwife

## 2019-04-15 VITALS — BP 120/80 | Wt 209.0 lb

## 2019-04-15 DIAGNOSIS — Z3483 Encounter for supervision of other normal pregnancy, third trimester: Secondary | ICD-10-CM

## 2019-04-15 DIAGNOSIS — Z3A38 38 weeks gestation of pregnancy: Secondary | ICD-10-CM

## 2019-04-15 LAB — POCT URINALYSIS DIPSTICK OB
Glucose, UA: NEGATIVE
POC,PROTEIN,UA: NEGATIVE

## 2019-04-15 NOTE — Addendum Note (Signed)
Addended by: Quintella Baton D on: 04/15/2019 10:52 AM   Modules accepted: Orders

## 2019-04-15 NOTE — Progress Notes (Signed)
  Routine Prenatal Care Visit  Subjective  Nancy Grimes is a 28 y.o. [phone] at [redacted]w[redacted]d being seen today for ongoing prenatal care.  She is currently monitored for the following issues for this low-risk pregnancy and has Common migraine; Obesity (BMI 30.0-34.9); Allergic contact dermatitis due to metals; Missed menses; and Supervision of other normal pregnancy, antepartum on their problem list.  ----------------------------------------------------------------------------------- Patient reports some difficulty sleeping.  She is considering tubal ligation for birth control. Explained process of 30 day consent. Will plan for interval tubal. Contractions: Irregular. Vag. Bleeding: None.  Movement: Present. Leaking Fluid denies.  ----------------------------------------------------------------------------------- The following portions of the patient's history were reviewed and updated as appropriate: allergies, current medications, past family history, past medical history, past social history, past surgical history and problem list. Problem list updated.  Objective  Blood pressure 120/80, weight 209 lb (94.8 kg), last menstrual period 12/09/2018, unknown if currently breastfeeding. Pregravid weight 202 lb (91.6 kg) Total Weight Gain 7 lb (3.175 kg) Urinalysis: Urine Protein    Urine Glucose    Fetal Status: Fetal Heart Rate (bpm): 130 Fundal Height: 39 cm Movement: Present     General:  Alert, oriented and cooperative. Patient is in no acute distress.  Skin: Skin is warm and dry. No rash noted.   Cardiovascular: Normal heart rate noted  Respiratory: Normal respiratory effort, no problems with respiration noted  Abdomen: Soft, gravid, appropriate for gestational age. Pain/Pressure: Present     Pelvic:  Cervical exam deferred        Extremities: Normal range of motion.  Edema: None  Mental Status: Normal mood and affect. Normal behavior. Normal judgment and thought content.   Assessment   28  y.o. [id number] at [redacted]w[redacted]d by  04/23/2019, by Ultrasound presenting for routine prenatal visit  Plan   pregnancy Problems (from 03/14/19 to present)    No problems associated with this episode.    Tubal consent signed today   Term labor symptoms and general obstetric precautions including but not limited to vaginal bleeding, contractions, leaking of fluid and fetal movement were reviewed in detail with the patient.    Return in about 1 week (around 04/22/2019) for rob.  Rod Can, CNM 04/15/2019 10:38 AM

## 2019-04-22 ENCOUNTER — Other Ambulatory Visit: Payer: Self-pay

## 2019-04-22 ENCOUNTER — Ambulatory Visit (INDEPENDENT_AMBULATORY_CARE_PROVIDER_SITE_OTHER): Payer: Medicaid Other | Admitting: Maternal Newborn

## 2019-04-22 ENCOUNTER — Encounter: Payer: Self-pay | Admitting: Maternal Newborn

## 2019-04-22 VITALS — BP 120/68 | Wt 207.0 lb

## 2019-04-22 DIAGNOSIS — Z3A39 39 weeks gestation of pregnancy: Secondary | ICD-10-CM

## 2019-04-22 DIAGNOSIS — Z3480 Encounter for supervision of other normal pregnancy, unspecified trimester: Secondary | ICD-10-CM

## 2019-04-22 DIAGNOSIS — Z3483 Encounter for supervision of other normal pregnancy, third trimester: Secondary | ICD-10-CM

## 2019-04-22 NOTE — Patient Instructions (Signed)

## 2019-04-22 NOTE — Progress Notes (Signed)
    Routine Prenatal Care Visit  Subjective  Nancy Grimes is a 28 y.o. [phone] at [redacted]w[redacted]d being seen today for ongoing prenatal care.  She is currently monitored for the following issues for this low-risk pregnancy and has Common migraine; Obesity (BMI 30.0-34.9); Allergic contact dermatitis due to metals; Missed menses; and Supervision of other normal pregnancy, antepartum on their problem list.  ----------------------------------------------------------------------------------- Patient reports no complaints.   Contractions: Irregular. Vag. Bleeding: None.  Movement: Present. No leaking of fluid.  ----------------------------------------------------------------------------------- The following portions of the patient's history were reviewed and updated as appropriate: allergies, current medications, past family history, past medical history, past social history, past surgical history and problem list. Problem list updated.   Objective  Blood pressure 120/68, weight 207 lb (93.9 kg), last menstrual period 12/09/2018, unknown if currently breastfeeding. Pregravid weight 202 lb (91.6 kg) Total Weight Gain 5 lb (2.268 kg)  Fetal Status: Fetal Heart Rate (bpm): 145   Movement: Present  Presentation: Vertex  General:  Alert, oriented and cooperative. Patient is in no acute distress.  Skin: Skin is warm and dry. No rash noted.   Cardiovascular: Normal heart rate noted  Respiratory: Normal respiratory effort, no problems with respiration noted  Abdomen: Soft, gravid, appropriate for gestational age. Pain/Pressure: Absent     Pelvic:  Cervical exam performed Dilation: 2.5 Effacement (%): 60 Station: -3, -2  Extremities: Normal range of motion.  Edema: None  Mental Status: Normal mood and affect. Normal behavior. Normal judgment and thought content.     Assessment   28 y.o. [id number] at [redacted]w[redacted]d, EDD 04/23/2019 by Ultrasound presenting for a routine prenatal visit.  Plan   pregnancy Problems  (from 03/14/19 to present)    No problems associated with this episode.    Discussed labor induction, prefers to wait for labor and schedule an office visit for next week.   Term labor symptoms and general obstetric precautions including but not limited to vaginal bleeding, contractions, leaking of fluid and fetal movement were reviewed.  Please refer to After Visit Summary for other counseling recommendations.   Return in about 1 week (around 04/29/2019) for ROB.  Avel Sensor, CNM 04/22/2019  4:50 PM

## 2019-04-22 NOTE — Progress Notes (Signed)
ROB- cervix check 

## 2019-04-29 ENCOUNTER — Other Ambulatory Visit: Payer: Self-pay

## 2019-04-29 ENCOUNTER — Ambulatory Visit (INDEPENDENT_AMBULATORY_CARE_PROVIDER_SITE_OTHER): Payer: Medicaid Other | Admitting: Advanced Practice Midwife

## 2019-04-29 VITALS — BP 128/68 | Wt 209.0 lb

## 2019-04-29 DIAGNOSIS — O480 Post-term pregnancy: Secondary | ICD-10-CM

## 2019-04-29 DIAGNOSIS — Z3A40 40 weeks gestation of pregnancy: Secondary | ICD-10-CM

## 2019-04-29 NOTE — Progress Notes (Signed)
  Routine Prenatal Care Visit  Subjective  Nancy Grimes is a 28 y.o. [phone] at [redacted]w[redacted]d being seen today for ongoing prenatal care.  She is currently monitored for the following issues for this low-risk pregnancy and has Common migraine; Obesity (BMI 30.0-34.9); Allergic contact dermatitis due to metals; Missed menses; and Supervision of other normal pregnancy, antepartum on their problem list.  ----------------------------------------------------------------------------------- Patient reports no complaints.   Contractions: Not present. Vag. Bleeding: None.  Movement: Present. Leaking Fluid denies.  ----------------------------------------------------------------------------------- The following portions of the patient's history were reviewed and updated as appropriate: allergies, current medications, past family history, past medical history, past social history, past surgical history and problem list. Problem list updated.  Objective  Blood pressure 128/68, weight 209 lb (94.8 kg), last menstrual period 12/09/2018 Pregravid weight 202 lb (91.6 kg) Total Weight Gain 7 lb (3.175 kg) Urinalysis: Urine Protein    Urine Glucose    Fetal Status: Fetal Heart Rate (bpm): 131   Movement: Present  Presentation: Vertex  General:  Alert, oriented and cooperative. Patient is in no acute distress.  Skin: Skin is warm and dry. No rash noted.   Cardiovascular: Normal heart rate noted  Respiratory: Normal respiratory effort, no problems with respiration noted  Abdomen: Soft, gravid, appropriate for gestational age. Pain/Pressure: Present     Pelvic:  Cervical exam performed Dilation: 3.5 Effacement (%): 60 Station: -2, cervical sweep  Extremities: Normal range of motion.  Edema: None  Mental Status: Normal mood and affect. Normal behavior. Normal judgment and thought content.   Assessment   28 y.o. [id number] at [redacted]w[redacted]d by  04/23/2019, by Ultrasound presenting for routine prenatal visit  Plan    pregnancy Problems (from 03/14/19 to present)    No problems associated with this episode.       Term labor symptoms and general obstetric precautions including but not limited to vaginal bleeding, contractions, leaking of fluid and fetal movement were reviewed in detail with the patient. Please refer to After Visit Summary for other counseling recommendations.   Will schedule induction and inform patient of date/time  Rod Can, [facility] 04/29/2019 12:09 PM

## 2019-04-29 NOTE — Progress Notes (Signed)
ROB

## 2019-05-01 ENCOUNTER — Encounter (HOSPITAL_COMMUNITY)
Admission: AD | Disposition: A | Discharge status: Home / Self Care | Payer: Self-pay | Source: Home / Self Care | Attending: Obstetrics and Gynecology

## 2019-05-01 ENCOUNTER — Other Ambulatory Visit: Payer: Self-pay

## 2019-05-01 ENCOUNTER — Inpatient Hospital Stay (HOSPITAL_COMMUNITY)
Admission: AD | Admit: 2019-05-01 | Discharge: 2019-05-03 | DRG: 786 | Disposition: A | Discharge status: Home / Self Care | Payer: Medicaid Other | Attending: Obstetrics and Gynecology | Admitting: Obstetrics and Gynecology

## 2019-05-01 ENCOUNTER — Inpatient Hospital Stay (HOSPITAL_COMMUNITY): Payer: Medicaid Other | Admitting: Anesthesiology

## 2019-05-01 ENCOUNTER — Encounter (HOSPITAL_COMMUNITY): Payer: Self-pay

## 2019-05-01 DIAGNOSIS — Z880 Allergy status to penicillin: Secondary | ICD-10-CM

## 2019-05-01 DIAGNOSIS — Z20828 Contact with and (suspected) exposure to other viral communicable diseases: Secondary | ICD-10-CM | POA: Diagnosis present

## 2019-05-01 DIAGNOSIS — Z87891 Personal history of nicotine dependence: Secondary | ICD-10-CM | POA: Diagnosis not present

## 2019-05-01 DIAGNOSIS — O480 Post-term pregnancy: Principal | ICD-10-CM | POA: Diagnosis present

## 2019-05-01 DIAGNOSIS — O99824 Streptococcus B carrier state complicating childbirth: Secondary | ICD-10-CM | POA: Diagnosis not present

## 2019-05-01 DIAGNOSIS — D62 Acute posthemorrhagic anemia: Secondary | ICD-10-CM | POA: Diagnosis not present

## 2019-05-01 DIAGNOSIS — Z3A41 41 weeks gestation of pregnancy: Secondary | ICD-10-CM

## 2019-05-01 DIAGNOSIS — O26893 Other specified pregnancy related conditions, third trimester: Secondary | ICD-10-CM | POA: Diagnosis present

## 2019-05-01 DIAGNOSIS — O360130 Maternal care for anti-D [Rh] antibodies, third trimester, not applicable or unspecified: Secondary | ICD-10-CM | POA: Diagnosis not present

## 2019-05-01 DIAGNOSIS — Z6791 Unspecified blood type, Rh negative: Secondary | ICD-10-CM

## 2019-05-01 DIAGNOSIS — E669 Obesity, unspecified: Secondary | ICD-10-CM | POA: Diagnosis present

## 2019-05-01 DIAGNOSIS — O4593 Premature separation of placenta, unspecified, third trimester: Secondary | ICD-10-CM | POA: Diagnosis present

## 2019-05-01 DIAGNOSIS — O9081 Anemia of the puerperium: Secondary | ICD-10-CM | POA: Diagnosis not present

## 2019-05-01 DIAGNOSIS — O4590 Premature separation of placenta, unspecified, unspecified trimester: Secondary | ICD-10-CM

## 2019-05-01 DIAGNOSIS — O99214 Obesity complicating childbirth: Secondary | ICD-10-CM | POA: Diagnosis present

## 2019-05-01 DIAGNOSIS — B951 Streptococcus, group B, as the cause of diseases classified elsewhere: Secondary | ICD-10-CM

## 2019-05-01 LAB — CBC
HCT: 32.4 % — ABNORMAL LOW (ref 36.0–46.0)
HCT: 29.7 % — ABNORMAL LOW (ref 36.0–46.0)
Hemoglobin: 11.4 g/dL — ABNORMAL LOW (ref 12.0–15.0)
Hemoglobin: 9.6 g/dL — ABNORMAL LOW (ref 12.0–15.0)
MCH: 30.4 pg (ref 26.0–34.0)
MCH: 28.2 pg (ref 26.0–34.0)
MCHC: 35.2 g/dL (ref 30.0–36.0)
MCHC: 32.3 g/dL (ref 30.0–36.0)
MCV: 86.4 fL (ref 80.0–100.0)
MCV: 87.4 fL (ref 80.0–100.0)
Platelets: 305 10*3/uL (ref 150–400)
Platelets: 294 10*3/uL (ref 150–400)
RBC: 3.75 MIL/uL — ABNORMAL LOW (ref 3.87–5.11)
RBC: 3.4 MIL/uL — ABNORMAL LOW (ref 3.87–5.11)
RDW: 18.3 % — ABNORMAL HIGH (ref 11.5–15.5)
RDW: 18.4 % — ABNORMAL HIGH (ref 11.5–15.5)
WBC: 10.9 10*3/uL — ABNORMAL HIGH (ref 4.0–10.5)
WBC: 13.2 10*3/uL — ABNORMAL HIGH (ref 4.0–10.5)
nRBC: 0 % (ref 0.0–0.2)
nRBC: 0 % (ref 0.0–0.2)

## 2019-05-01 LAB — CREATININE, SERUM
Creatinine, Ser: 0.49 mg/dL (ref 0.44–1.00)
GFR calc Af Amer: >60 mL/min (ref >60)
GFR calc non Af Amer: >60 mL/min (ref >60)

## 2019-05-01 LAB — DIC (DISSEMINATED INTRAVASCULAR COAGULATION)PANEL
D-Dimer, Quant: 2.58 ug/mL-FEU — ABNORMAL HIGH (ref 0.00–0.50)
Fibrinogen: 387 mg/dL (ref 210–475)
INR: 1.1 (ref 0.8–1.2)
Platelets: 308 10*3/uL (ref 150–400)
Prothrombin Time: 14.4 seconds (ref 11.4–15.2)
Smear Review: NONE SEEN
aPTT: 26 seconds (ref 24–36)

## 2019-05-01 LAB — RPR: RPR Ser Ql: NONREACTIVE

## 2019-05-01 LAB — SARS CORONAVIRUS 2 BY RT PCR (HOSPITAL ORDER, PERFORMED IN ~~LOC~~ HOSPITAL LAB): SARS Coronavirus 2: NEGATIVE

## 2019-05-01 SURGERY — Surgical Case
Anesthesia: Epidural

## 2019-05-01 MED ORDER — ACETAMINOPHEN 325 MG PO TABS: 650.0000 mg | ORAL_TABLET | ORAL | Status: DC | PRN

## 2019-05-01 MED ORDER — SENNOSIDES-DOCUSATE SODIUM 8.6-50 MG PO TABS
2.0000 | ORAL_TABLET | ORAL | Status: DC
  Administered 2019-05-01 – 2019-05-02 (×2): 2 via ORAL
  Filled 2019-05-01 (×3): qty 2

## 2019-05-01 MED ORDER — DEXAMETHASONE SODIUM PHOSPHATE 10 MG/ML IJ SOLN
INTRAMUSCULAR | Status: DC | PRN
  Administered 2019-05-01: 10 mg via INTRAVENOUS

## 2019-05-01 MED ORDER — ONDANSETRON HCL 4 MG/2ML IJ SOLN: 4.0000 mg | Freq: Four times a day (QID) | INTRAMUSCULAR | Status: DC | PRN

## 2019-05-01 MED ORDER — GABAPENTIN 100 MG PO CAPS
100.0000 mg | ORAL_CAPSULE | Freq: Three times a day (TID) | ORAL | Status: DC
  Administered 2019-05-01 – 2019-05-03 (×6): 100 mg via ORAL
  Filled 2019-05-01 (×6): qty 1

## 2019-05-01 MED ORDER — VANCOMYCIN HCL IN DEXTROSE 1-5 GM/200ML-% IV SOLN
1000.0000 mg | Freq: Two times a day (BID) | INTRAVENOUS | Status: DC
  Administered 2019-05-01: 03:00:00 1000 mg via INTRAVENOUS
  Filled 2019-05-01: qty 200

## 2019-05-01 MED ORDER — PRENATAL MULTIVITAMIN CH
1.0000 | ORAL_TABLET | Freq: Every day | ORAL | Status: DC
  Administered 2019-05-02 – 2019-05-03 (×2): 1 via ORAL
  Filled 2019-05-01 (×2): qty 1

## 2019-05-01 MED ORDER — LACTATED RINGERS IV SOLN
500.0000 mL | Freq: Once | INTRAVENOUS | Status: AC
  Administered 2019-05-01: 500 mL via INTRAVENOUS

## 2019-05-01 MED ORDER — SUFENTANIL CITRATE 50 MCG/ML IV SOLN: INTRAVENOUS | Status: DC | PRN

## 2019-05-01 MED ORDER — MIDAZOLAM HCL 2 MG/2ML IJ SOLN
INTRAMUSCULAR | Status: DC | PRN
  Administered 2019-05-01: 1 mg via INTRAVENOUS

## 2019-05-01 MED ORDER — MENTHOL 3 MG MT LOZG: 1.0000 | LOZENGE | OROMUCOSAL | Status: DC | PRN

## 2019-05-01 MED ORDER — ALBUMIN HUMAN 5 % IV SOLN
INTRAVENOUS | Status: DC | PRN
  Administered 2019-05-01 (×3): via INTRAVENOUS

## 2019-05-01 MED ORDER — ONDANSETRON HCL 4 MG/2ML IJ SOLN: 4.0000 mg | Freq: Three times a day (TID) | INTRAMUSCULAR | Status: DC | PRN

## 2019-05-01 MED ORDER — LIDOCAINE HCL (PF) 1 % IJ SOLN: 30.0000 mL | INTRAMUSCULAR | Status: DC | PRN

## 2019-05-01 MED ORDER — TERBUTALINE SULFATE 1 MG/ML IJ SOLN: 0.2500 mg | Freq: Once | INTRAMUSCULAR | Status: DC | PRN

## 2019-05-01 MED ORDER — LACTATED RINGERS IV SOLN
INTRAVENOUS | Status: DC
  Administered 2019-05-02: 01:00:00 via INTRAVENOUS

## 2019-05-01 MED ORDER — ONDANSETRON HCL 4 MG/2ML IJ SOLN
INTRAMUSCULAR | Status: DC | PRN
  Administered 2019-05-01: 4 mg via INTRAVENOUS

## 2019-05-01 MED ORDER — EPHEDRINE 5 MG/ML INJ
10.0000 mg | INTRAVENOUS | Status: AC | PRN
  Administered 2019-05-01 (×2): 5 mg via INTRAVENOUS

## 2019-05-01 MED ORDER — SCOPOLAMINE 1 MG/3DAYS TD PT72
1.0000 | MEDICATED_PATCH | Freq: Once | TRANSDERMAL | Status: DC
  Administered 2019-05-01: 20:00:00 1.5 mg via TRANSDERMAL
  Filled 2019-05-01: qty 1

## 2019-05-01 MED ORDER — LACTATED RINGERS IV SOLN
INTRAVENOUS | Status: DC | PRN
  Administered 2019-05-01: 11:00:00 via INTRAVENOUS

## 2019-05-01 MED ORDER — STERILE WATER FOR IRRIGATION IR SOLN
Status: DC | PRN
  Administered 2019-05-01: 1000 mL

## 2019-05-01 MED ORDER — DIPHENHYDRAMINE HCL 25 MG PO CAPS: 25.0000 mg | ORAL_CAPSULE | Freq: Four times a day (QID) | ORAL | Status: DC | PRN

## 2019-05-01 MED ORDER — PHENYLEPHRINE 40 MCG/ML (10ML) SYRINGE FOR IV PUSH (FOR BLOOD PRESSURE SUPPORT): 80.0000 ug | PREFILLED_SYRINGE | INTRAVENOUS | Status: DC | PRN

## 2019-05-01 MED ORDER — DIPHENHYDRAMINE HCL 50 MG/ML IJ SOLN: 12.5000 mg | INTRAMUSCULAR | Status: DC | PRN

## 2019-05-01 MED ORDER — SOD CITRATE-CITRIC ACID 500-334 MG/5ML PO SOLN
30.0000 mL | ORAL | Status: DC | PRN
  Filled 2019-05-01: qty 30

## 2019-05-01 MED ORDER — OXYCODONE-ACETAMINOPHEN 5-325 MG PO TABS
1.0000 | ORAL_TABLET | ORAL | Status: DC | PRN
  Administered 2019-05-02: 1 via ORAL
  Administered 2019-05-03: 03:00:00 2 via ORAL
  Filled 2019-05-01: qty 1
  Filled 2019-05-01: qty 2

## 2019-05-01 MED ORDER — OXYCODONE-ACETAMINOPHEN 5-325 MG PO TABS: 1.0000 | ORAL_TABLET | ORAL | Status: DC | PRN

## 2019-05-01 MED ORDER — FLEET ENEMA 7-19 GM/118ML RE ENEM: 1.0000 | ENEMA | Freq: Every day | RECTAL | Status: DC | PRN

## 2019-05-01 MED ORDER — OXYTOCIN 40 UNITS IN NORMAL SALINE INFUSION - SIMPLE MED
2.5000 [IU]/h | INTRAVENOUS | Status: DC
  Filled 2019-05-01: qty 1000

## 2019-05-01 MED ORDER — FENTANYL CITRATE (PF) 100 MCG/2ML IJ SOLN
INTRAMUSCULAR | Status: DC | PRN
  Administered 2019-05-01 (×2): 50 ug via INTRAVENOUS

## 2019-05-01 MED ORDER — DIPHENHYDRAMINE HCL 25 MG PO CAPS
25.0000 mg | ORAL_CAPSULE | ORAL | Status: DC | PRN
  Administered 2019-05-02: 25 mg via ORAL
  Filled 2019-05-01: qty 1

## 2019-05-01 MED ORDER — SODIUM CHLORIDE 0.9% FLUSH: 3.0000 mL | INTRAVENOUS | Status: DC | PRN

## 2019-05-01 MED ORDER — OXYCODONE-ACETAMINOPHEN 5-325 MG PO TABS: 2.0000 | ORAL_TABLET | ORAL | Status: DC | PRN

## 2019-05-01 MED ORDER — OXYTOCIN 40 UNITS IN NORMAL SALINE INFUSION - SIMPLE MED: 2.5000 [IU]/h | INTRAVENOUS | Status: AC

## 2019-05-01 MED ORDER — NALOXONE HCL 4 MG/10ML IJ SOLN
1.0000 ug/kg/h | INTRAVENOUS | Status: DC | PRN
  Filled 2019-05-01: qty 5

## 2019-05-01 MED ORDER — CLINDAMYCIN PHOSPHATE 900 MG/50ML IV SOLN
INTRAVENOUS | Status: DC | PRN
  Administered 2019-05-01: 900 mg via INTRAVENOUS

## 2019-05-01 MED ORDER — SIMETHICONE 80 MG PO CHEW: 80.0000 mg | CHEWABLE_TABLET | ORAL | Status: DC | PRN

## 2019-05-01 MED ORDER — SIMETHICONE 80 MG PO CHEW
80.0000 mg | CHEWABLE_TABLET | ORAL | Status: DC
  Administered 2019-05-01 – 2019-05-02 (×2): 80 mg via ORAL
  Filled 2019-05-01 (×2): qty 1

## 2019-05-01 MED ORDER — PHENYLEPHRINE HCL-NACL 20-0.9 MG/250ML-% IV SOLN
INTRAVENOUS | Status: DC | PRN
  Administered 2019-05-01: 60 ug/min via INTRAVENOUS

## 2019-05-01 MED ORDER — DIBUCAINE (PERIANAL) 1 % EX OINT: 1.0000 "application " | TOPICAL_OINTMENT | CUTANEOUS | Status: DC | PRN

## 2019-05-01 MED ORDER — ENOXAPARIN SODIUM 40 MG/0.4ML ~~LOC~~ SOLN
40.0000 mg | SUBCUTANEOUS | Status: DC
  Administered 2019-05-02 – 2019-05-03 (×2): 40 mg via SUBCUTANEOUS
  Filled 2019-05-01 (×2): qty 0.4

## 2019-05-01 MED ORDER — OXYTOCIN 40 UNITS IN NORMAL SALINE INFUSION - SIMPLE MED
INTRAVENOUS | Status: AC
  Filled 2019-05-01: qty 1000

## 2019-05-01 MED ORDER — MORPHINE SULFATE (PF) 0.5 MG/ML IJ SOLN
INTRAMUSCULAR | Status: AC
  Filled 2019-05-01: qty 10

## 2019-05-01 MED ORDER — FENTANYL CITRATE (PF) 100 MCG/2ML IJ SOLN
INTRAMUSCULAR | Status: AC
  Filled 2019-05-01: qty 2

## 2019-05-01 MED ORDER — FENTANYL CITRATE (PF) 100 MCG/2ML IJ SOLN: 100.0000 ug | INTRAMUSCULAR | Status: DC | PRN

## 2019-05-01 MED ORDER — LACTATED RINGERS IV SOLN
INTRAVENOUS | Status: DC
  Administered 2019-05-01 (×2): via INTRAVENOUS

## 2019-05-01 MED ORDER — ALBUMIN HUMAN 5 % IV SOLN
INTRAVENOUS | Status: AC
  Filled 2019-05-01: qty 500

## 2019-05-01 MED ORDER — FENTANYL-BUPIVACAINE-NACL 0.5-0.125-0.9 MG/250ML-% EP SOLN
12.0000 mL/h | EPIDURAL | Status: DC | PRN
  Filled 2019-05-01: qty 250

## 2019-05-01 MED ORDER — LIDOCAINE-EPINEPHRINE (PF) 2 %-1:200000 IJ SOLN
INTRAMUSCULAR | Status: DC | PRN
  Administered 2019-05-01: 5 mL via EPIDURAL
  Administered 2019-05-01: 3 mL via EPIDURAL
  Administered 2019-05-01: 5 mL via EPIDURAL
  Administered 2019-05-01: 2 mL via EPIDURAL
  Administered 2019-05-01: 5 mL via EPIDURAL

## 2019-05-01 MED ORDER — SODIUM CHLORIDE 0.9 % IR SOLN
Status: DC | PRN
  Administered 2019-05-01: 1

## 2019-05-01 MED ORDER — TETANUS-DIPHTH-ACELL PERTUSSIS 5-2.5-18.5 LF-MCG/0.5 IM SUSP: 0.5000 mL | Freq: Once | INTRAMUSCULAR | Status: DC

## 2019-05-01 MED ORDER — MORPHINE SULFATE (PF) 0.5 MG/ML IJ SOLN
INTRAMUSCULAR | Status: DC | PRN
  Administered 2019-05-01: 3 mg via EPIDURAL

## 2019-05-01 MED ORDER — SIMETHICONE 80 MG PO CHEW
80.0000 mg | CHEWABLE_TABLET | Freq: Three times a day (TID) | ORAL | Status: DC
  Administered 2019-05-01 – 2019-05-03 (×6): 80 mg via ORAL
  Filled 2019-05-01 (×6): qty 1

## 2019-05-01 MED ORDER — LACTATED RINGERS IV SOLN: 500.0000 mL | INTRAVENOUS | Status: DC | PRN

## 2019-05-01 MED ORDER — OXYTOCIN BOLUS FROM INFUSION: 500.0000 mL | Freq: Once | INTRAVENOUS | Status: DC

## 2019-05-01 MED ORDER — SODIUM CHLORIDE (PF) 0.9 % IJ SOLN
INTRAMUSCULAR | Status: DC | PRN
  Administered 2019-05-01: 12 mL/h via EPIDURAL

## 2019-05-01 MED ORDER — LIDOCAINE HCL (PF) 1 % IJ SOLN
INTRAMUSCULAR | Status: DC | PRN
  Administered 2019-05-01 (×2): 5 mL via EPIDURAL

## 2019-05-01 MED ORDER — WITCH HAZEL-GLYCERIN EX PADS: 1.0000 "application " | MEDICATED_PAD | CUTANEOUS | Status: DC | PRN

## 2019-05-01 MED ORDER — NALOXONE HCL 0.4 MG/ML IJ SOLN: 0.4000 mg | INTRAMUSCULAR | Status: DC | PRN

## 2019-05-01 MED ORDER — DEXAMETHASONE SODIUM PHOSPHATE 10 MG/ML IJ SOLN
INTRAMUSCULAR | Status: AC
  Filled 2019-05-01: qty 1

## 2019-05-01 MED ORDER — MEPERIDINE HCL 25 MG/ML IJ SOLN: 6.2500 mg | INTRAMUSCULAR | Status: DC | PRN

## 2019-05-01 MED ORDER — OXYTOCIN 40 UNITS IN NORMAL SALINE INFUSION - SIMPLE MED
1.0000 m[IU]/min | INTRAVENOUS | Status: DC
  Administered 2019-05-01: 05:00:00 2 m[IU]/min via INTRAVENOUS
  Administered 2019-05-01: 4 m[IU]/min via INTRAVENOUS

## 2019-05-01 MED ORDER — CLINDAMYCIN PHOSPHATE 900 MG/50ML IV SOLN
INTRAVENOUS | Status: AC
  Filled 2019-05-01: qty 50

## 2019-05-01 MED ORDER — EPHEDRINE 5 MG/ML INJ: 10.0000 mg | INTRAVENOUS | Status: DC | PRN

## 2019-05-01 MED ORDER — COCONUT OIL OIL: 1.0000 "application " | TOPICAL_OIL | Status: DC | PRN

## 2019-05-01 MED ORDER — MIDAZOLAM HCL 2 MG/2ML IJ SOLN
INTRAMUSCULAR | Status: AC
  Filled 2019-05-01: qty 2

## 2019-05-01 MED ORDER — LIDOCAINE-EPINEPHRINE (PF) 2 %-1:200000 IJ SOLN
INTRAMUSCULAR | Status: AC
  Filled 2019-05-01: qty 10

## 2019-05-01 MED ORDER — GENTAMICIN SULFATE 40 MG/ML IJ SOLN
5.0000 mg/kg | Freq: Once | INTRAVENOUS | Status: AC
  Administered 2019-05-01: 13:00:00 350 mg via INTRAVENOUS
  Filled 2019-05-01: qty 8.75

## 2019-05-01 MED ORDER — ONDANSETRON HCL 4 MG/2ML IJ SOLN
INTRAMUSCULAR | Status: AC
  Filled 2019-05-01: qty 2

## 2019-05-01 SURGICAL SUPPLY — 34 items
APL SKNCLS STERI-STRIP NONHPOA (GAUZE/BANDAGES/DRESSINGS) ×1
BENZOIN TINCTURE PRP APPL 2/3 (GAUZE/BANDAGES/DRESSINGS) ×3 IMPLANT
BRR ADH 6X5 SEPRAFILM 1 SHT (MISCELLANEOUS)
CHLORAPREP W/TINT 26ML (MISCELLANEOUS) ×3 IMPLANT
CLAMP CORD UMBIL (MISCELLANEOUS) IMPLANT
CLOSURE STERI STRIP 1/2 X4 (GAUZE/BANDAGES/DRESSINGS) ×3 IMPLANT
DRSG OPSITE POSTOP 4X10 (GAUZE/BANDAGES/DRESSINGS) ×3 IMPLANT
DRSG PAD ABDOMINAL 8X10 ST (GAUZE/BANDAGES/DRESSINGS) ×4 IMPLANT
ELECT REM PT RETURN 9FT ADLT (ELECTROSURGICAL) ×3
ELECTRODE REM PT RTRN 9FT ADLT (ELECTROSURGICAL) ×1 IMPLANT
EXTRACTOR VACUUM M CUP 4 TUBE (SUCTIONS) IMPLANT
EXTRACTOR VACUUM M CUP 4' TUBE (SUCTIONS)
GLOVE BIOGEL PI IND STRL 6.5 (GLOVE) ×1 IMPLANT
GLOVE BIOGEL PI IND STRL 7.0 (GLOVE) ×1 IMPLANT
GLOVE BIOGEL PI INDICATOR 6.5 (GLOVE) ×2
GLOVE BIOGEL PI INDICATOR 7.0 (GLOVE) ×2
GLOVE SURG SS PI 6.0 STRL IVOR (GLOVE) ×3 IMPLANT
GOWN STRL REUS W/TWL LRG LVL3 (GOWN DISPOSABLE) ×6 IMPLANT
KIT ABG SYR 3ML LUER SLIP (SYRINGE) IMPLANT
NEEDLE HYPO 25X5/8 SAFETYGLIDE (NEEDLE) IMPLANT
NS IRRIG 1000ML POUR BTL (IV SOLUTION) ×3 IMPLANT
PACK C SECTION WH (CUSTOM PROCEDURE TRAY) ×3 IMPLANT
PAD OB MATERNITY 4.3X12.25 (PERSONAL CARE ITEMS) ×3 IMPLANT
PENCIL SMOKE EVAC W/HOLSTER (ELECTROSURGICAL) ×3 IMPLANT
RTRCTR C-SECT PINK 25CM LRG (MISCELLANEOUS) ×2 IMPLANT
SEPRAFILM MEMBRANE 5X6 (MISCELLANEOUS) IMPLANT
SUT PLAIN 0 NONE (SUTURE) IMPLANT
SUT VIC AB 0 CT1 36 (SUTURE) ×12 IMPLANT
SUT VIC AB 0 CTX 36 (SUTURE) ×3
SUT VIC AB 0 CTX36XBRD ANBCTRL (SUTURE) IMPLANT
SUT VIC AB 4-0 KS 27 (SUTURE) ×3 IMPLANT
TOWEL OR 17X24 6PK STRL BLUE (TOWEL DISPOSABLE) ×3 IMPLANT
TRAY FOLEY W/BAG SLVR 14FR LF (SET/KITS/TRAYS/PACK) ×3 IMPLANT
WATER STERILE IRR 1000ML POUR (IV SOLUTION) ×3 IMPLANT

## 2019-05-01 NOTE — Progress Notes (Signed)
Nancy Grimes is a 28 y.o. [id number] at [redacted]w[redacted]d by LMP admitted for active labor  Subjective: Pt feels dizzy after vasovagal episode.    Objective: BP 124/64   Pulse 79   Temp 98.2 F (36.8 C) (Oral)   Resp 18   Ht 5\' 4"  (1.626 m)   Wt 95.2 kg   LMP 12/09/2018 (Approximate)   SpO2 98%   BMI 36.03 kg/m  I/O last 3 completed shifts: In: -  Out: 250 [Urine:250] Total I/O In: -  Out: 250 [Urine:250]  Alerted to see patient during epidural at 04:05. Loss of consciousness during epidural. Pt was immediately placed on right side. 10L of oxygen was given. Sats were 88% initially and then came up to 100%. Pt was unresponsive with eyes open for approx 1 minute. No facial droop, limb weakness, dysphasia or seizures.  FHT:  FHR: 140 bpm, variability: moderate,  accelerations:  Present,  decelerations:  Absent UC:   regular, every 3-4.5 minutes SVE:   Dilation: 5.5 Effacement (%): 60 Station: -2 Exam by:: Varney Baas, RN   General: Alert and cooperative and appears to be in no acute distress Cardio: Normal S1 and S2, no S3 or S4. Rhythm is regular. No murmurs or rubs.   Pulm: Clear to auscultation bilaterally, no crackles, wheezing, or diminished breath sounds. Normal respiratory effort Abdomen: Bowel sounds normal. Abdomen soft and non-tender.  Extremities: No peripheral edema. Warm/ well perfused.  Neuro: Cranial nerves grossly intact. PERLA Labs: Lab Results  Component Value Date   WBC 10.9 (H) 05/01/2019   HGB 11.4 (L) 05/01/2019   HCT 32.4 (L) 05/01/2019   MCV 86.4 05/01/2019   PLT 305 05/01/2019    Assessment / Plan: Nancy Grimes is a 28 y.o. [id number] at [redacted]w[redacted]d by LMP admitted for active labor  Labor: q80mins vital signs. Likely vasovagal from anesthesia.  Fetal Wellbeing:  Category I Pain Control:  Epidural I/D:  gbs positive-on vancomycin  Anticipated MOD:  NSVD anticipated. c-section if maternal/fetal indication   Lattie Haw MD 05/01/2019, 9:19 AM

## 2019-05-01 NOTE — H&P (Addendum)
OBSTETRIC ADMISSION HISTORY AND PHYSICAL  Nancy Grimes is a 28 y.o. female [phone] with IUP at [redacted]w[redacted]d presenting for SOL. Reports good fetal movement. Denies vaginal bleeding. Reports mucus plug today.  Reports chronic migraines. No change in headaches throughout the pregnancy. No visual changes, chest pain, SOB, dizziness or fevers.   She received her prenatal care at [facility]. Late to prenatal care at 34 weeks.   Support person in labor: Husband   Ultrasounds . Anatomy U/S: missed  Prenatal History/Complications: Conception on OCP during this pregnancy and 2 other pregnancies.   Past Medical History: Past Medical History:  Diagnosis Date  . Anemia   . Chronic headaches   . Miscarriage   . Rh negative state in antepartum period, third trimester     Past Surgical History: Past Surgical History:  Procedure Laterality Date  . ARM SURGERY  2007   RIGHT ELBOW  . VAGINAL DELIVERY     x3    Obstetrical History: OB History    Gravida  5   Para  3   Term  3   Preterm      AB  1   Living  3     SAB  1   TAB      Ectopic      Multiple  0   Live Births  3           Social History: Social History   Socioeconomic History  . Marital status: Married    Spouse name: Not on file  . Number of children: 4  . Years of education: 29  . Highest education level: Not on file  Occupational History  . Occupation: stay at home mom  . 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 Financial resource strain: Not on file  . Food insecurity    Worry: Not on file    Inability: Not on file  . Transportation needs    Medical: Not on file    Non-medical: Not on file  Tobacco Use  . Smoking status: Former Smoker    Packs/day: 0.25    Years: 2.00    Pack years: 0.50    Quit date: 04/21/2012    Years since quitting: 7.0  . Smokeless tobacco: Never Used  Substance and Sexual Activity  . Alcohol use: No    Comment: occasionally-half a  glass a week  . Drug use: No  . Sexual activity: Yes    Partners: Male    Birth control/protection: None    Comment: with monogamous husband  Lifestyle  . Physical activity    Days per week: Not on file    Minutes per session: Not on file  . Stress: Not on file  Relationships  . Social Herbalist on phone: Not on file    Gets together: Not on file    Attends religious service: Not on file    Active member of club or organization: Not on file    Attends meetings of clubs or organizations: Not on file    Relationship status: Not on file  Other Topics Concern  . Not on file  Social History Narrative  . Not on file    Family History: Family History  Problem Relation Age of Onset  . Hypertension Father   . Hyperlipidemia Father   . Diabetes Paternal Aunt        type 2  . Diabetes Paternal Grandmother  type2  . Thyroid disease Cousin   . Hyperlipidemia Maternal Grandmother   . Heart disease Maternal Grandmother   . Heart disease Maternal Grandfather   . Hyperlipidemia Maternal Grandfather   . Stroke Maternal Grandfather     Allergies: Allergies  Allergen Reactions  . Salicylic Acid Rash    Acne Medications  . Other     SKIN ALLERGY-ACNE RX; salycylic acid  . Penicillin G Swelling    Medications Prior to Admission  Medication Sig Dispense Refill Last Dose  . acetaminophen (TYLENOL) 325 MG tablet Take 650 mg by mouth every 6 (six) hours as needed for mild pain.      . Prenatal Vit-Fe Fumarate-FA (MULTIVITAMIN-PRENATAL) 27-0.8 MG TABS tablet Take 1 tablet by mouth daily at 12 noon.        Review of Systems  All systems reviewed and negative except as stated in HPI  Blood pressure 134/66, pulse 74, temperature 98.1 F (36.7 C), temperature source Oral, resp. rate 19, height 5\' 4"  (1.626 m), weight 95.2 kg, last menstrual period 12/09/2018, SpO2 100 %, unknown if currently breastfeeding. General appearance: alert, cooperative and appears stated  age Lungs: chest clear on ausc, no crackles or wheeze, no respiratory distress Heart: RRR, no rubs or gallops  Abdomen: soft, non-tender; gravid abdomen  Pelvic: 5.5, 50, -2,-3 Extremities: Homans sign is negative, no sign of DVT Presentation: cephalic Fetal monitoring: 135 bpm, good variability, accels present, no decels  Uterine activity: 4-5 mins  Dilation: 5.5 Effacement (%): 50 Station: -2, -3 Exam by:: Suezanne Jacquet, RN  Prenatal labs: ABO, Rh: A/Negative/-- (08/12 1206) Antibody: Negative (08/12 1206) Rubella: 1.82 (08/12 1206) RPR: Non Reactive (08/12 1206)  HBsAg: Negative (08/12 1206)  HIV: Non Reactive (08/12 1206)  GBS: Positive/-- (08/19 1616)  Glucola: wnl Genetic screening:  Declined   Prenatal Transfer Tool  Maternal Diabetes: No Genetic Screening: Declined Maternal Ultrasounds/Referrals: Normal Fetal Ultrasounds or other Referrals:  None Maternal Substance Abuse:  No Significant Maternal Medications:  None Significant Maternal Lab Results: Group B Strep positive  No results found for this or any previous visit (from the past 24 hour(s)).  Patient Active Problem List   Diagnosis Date Noted  . Supervision of other normal pregnancy, antepartum 03/14/2019  . Allergic contact dermatitis due to metals 02/17/2019  . Missed menses 02/17/2019  . Common migraine 06/16/2018  . Obesity (BMI 30.0-34.9) 06/16/2018    Assessment/Plan:  Nancy Grimes is a 28 y.o. [id number] at [redacted]w[redacted]d here for SOL.   Labor:  Cervix 5.5, 50, -2 -3. Consider Pitocin and AROM. -- pain control: PRN fentanyl 129mcg/hr, pt desires epidural   Fetal Wellbeing: EFW 7-8lb by [facility]. Cephalic by cervical exam  -- GBS positive-pencillin allergy-widespread edema. Clindamycin resistant. Started on Vancomycin. -- continuous fetal monitoring   Postpartum Planning -- Breast feeding  -- Rhogam given: 03/23/19   --Tdap ?? --Contraception, Bilateral tube ligation, not as inpatient as insurance  does not cover.  Towanda Octave MD PGY-1, [facility]   OB FELLOW ATTESTATION  I have seen and examined this patient and agree with above documentation in the resident's note except as noted below.  28 y/o [id number] at [redacted]w[redacted]d presents in labor. Prenatal labs notable for Rh- and will need rhogam PP as well as GBS positive. On my history she reports throat swelling as reaction to penicillin. Despite documentation that patient has had cefazolin for GBS prophylaxis in prior deliveries she is reporting anaphylactic like symptoms to penicillins, culture data is  available and she is clinda resistant, started on Vancomycin. Will allow 4hr of treatment prior to augmenting labor. Of note would like a tubal ligation as she has conceived multiple times while taking OCP's, including this pregnancy. Unfortunately did not sign consent until this month so will need interval tubal.   Zack SealMateo Aadan Chenier, MD/[facility] OB Fellow  05/01/2019, 5:02 AM

## 2019-05-01 NOTE — Anesthesia Procedure Notes (Signed)
Epidural Patient location during procedure: OB Start time: 05/01/2019 3:48 AM End time: 05/01/2019 3:58 AM  Staffing Anesthesiologist: Murvin Natal, MD Performed: anesthesiologist   Preanesthetic Checklist Completed: patient identified, site marked, pre-op evaluation, timeout performed, IV checked, risks and benefits discussed and monitors and equipment checked  Epidural Patient position: sitting Prep: DuraPrep Patient monitoring: heart rate, cardiac monitor, continuous pulse ox and blood pressure Approach: midline Location: L4-L5 Injection technique: LOR air  Needle:  Needle type: Tuohy  Needle gauge: 17 G Needle length: 9 cm Needle insertion depth: 6 cm Catheter type: closed end flexible Catheter size: 19 Gauge Catheter at skin depth: 11 cm Test dose: negative and Other  Assessment Events: blood not aspirated, injection not painful, no injection resistance and negative IV test  Additional Notes Informed consent obtained prior to proceeding including risk of failure, 1% risk of PDPH, risk of minor discomfort and bruising. Discussed alternatives to epidural analgesia and patient desires to proceed.  Timeout performed pre-procedure verifying patient name, procedure, and platelet count. Patient with vagal response after epidural catheter placed. Patient placed in right lateral decubitus and oxygen applied. Vitals monitored and remained stable. Additional support personal present in the room.  Reason for block:procedure for pain

## 2019-05-01 NOTE — Progress Notes (Signed)
Pt received epidural at 0405.  Lost consciousness at [place]. Care team placed pt on right side immediately, applied 10 L of O2, applied [place] monitor. Oxygen sats read 88 at 0409 and then 100% at 0414. Pt alert at oriented by 0412. Dr. Reather Laurence, Dione Plover and Ellender at bedside until pt alert and oriented. Will continue to monitor.

## 2019-05-01 NOTE — Anesthesia Preprocedure Evaluation (Addendum)
Anesthesia Evaluation  Patient identified by MRN, date of birth, ID band Patient awake    Reviewed: Allergy & Precautions, H&P , NPO status , Patient's Chart, lab work & pertinent test results  History of Anesthesia Complications Negative for: history of anesthetic complications  Airway Mallampati: II  TM Distance: >3 FB Neck ROM: full    Dental no notable dental hx. (+) Teeth Intact   Pulmonary former smoker,    Pulmonary exam normal breath sounds clear to auscultation       Cardiovascular negative cardio ROS Normal cardiovascular exam Rhythm:regular Rate:Normal     Neuro/Psych  Headaches, negative psych ROS   GI/Hepatic negative GI ROS, Neg liver ROS,   Endo/Other  negative endocrine ROS  Renal/GU negative Renal ROS  negative genitourinary   Musculoskeletal   Abdominal (+) + obese,   Peds  Hematology  (+) Blood dyscrasia, anemia ,   Anesthesia Other Findings   Reproductive/Obstetrics (+) Pregnancy                             Anesthesia Physical Anesthesia Plan  ASA: II and emergent  Anesthesia Plan: Epidural   Post-op Pain Management:    Induction:   PONV Risk Score and Plan: Scopolamine patch - Pre-op, Ondansetron and Treatment may vary due to age or medical condition  Airway Management Planned: Natural Airway  Additional Equipment:   Intra-op Plan:   Post-operative Plan:   Informed Consent: I have reviewed the patients History and Physical, chart, labs and discussed the procedure including the risks, benefits and alternatives for the proposed anesthesia with the patient or authorized representative who has indicated his/her understanding and acceptance.       Plan Discussed with: CRNA, Anesthesiologist and Surgeon  Anesthesia Plan Comments: (Patient with antepartum hemorrhage for emergent C/Section. Will use epidural for C/Section. M. Royce Macadamia, MD)        Anesthesia Quick Evaluation

## 2019-05-01 NOTE — Transfer of Care (Signed)
Immediate Anesthesia Transfer of Care Note  Patient: Nancy Grimes  Procedure(s) Performed: CESAREAN SECTION (N/A )  Patient Location: PACU  Anesthesia Type:Epidural  Level of Consciousness: awake and alert   Airway & Oxygen Therapy: Patient Spontanous Breathing and Patient connected to face mask oxygen  Post-op Assessment: Report given to RN and Post -op Vital signs reviewed and stable  Post vital signs: Reviewed  Last Vitals:  Vitals Value Taken Time  BP 115/58 05/01/19 1320  Temp 35.9 C 05/01/19 1256  Pulse 90 05/01/19 1324  Resp 17 05/01/19 1324  SpO2 97 % 05/01/19 1324  Vitals shown include unvalidated device data.  Last Pain:  Vitals:   05/01/19 1315  TempSrc: (P) Axillary  PainSc:          Complications: No apparent anesthesia complications

## 2019-05-01 NOTE — Progress Notes (Signed)
Nancy Grimes is a 28 y.o. [phone] at [redacted]w[redacted]d admitted for active labor  Subjective: Pt comfortable with epidural. Husband at beside for support.  Objective: BP 109/61   Pulse 68   Temp 98.2 F (36.8 C) (Oral)   Resp 18   Ht 5\' 4"  (1.626 m)   Wt 95.2 kg   LMP 12/09/2018 (Approximate)   SpO2 98%   BMI 36.03 kg/m  I/O last 3 completed shifts: In: -  Out: 250 [Urine:250] Total I/O In: -  Out: 775 [Urine:775]  FHT:  FHR: 135 bpm, variability: moderate,  accelerations:  Present,  decelerations:  Absent UC:   regular, every 3-4 minutes SVE:   Dilation: 6 Effacement (%): 80 Station: -2 Exam by:: Fatima Blank, CNM  Labs: Lab Results  Component Value Date   WBC 10.9 (H) 05/01/2019   HGB 11.4 (L) 05/01/2019   HCT 32.4 (L) 05/01/2019   MCV 86.4 05/01/2019   PLT 305 05/01/2019    Assessment / Plan:  Called to room by RN for large amount of bleeding. Pt with bright red bleeding soaking sheets of bed and on her legs upon arrival. Large clots also noted.  Cervical exam unchanged. Pt continues to have gushes of blood with contractions. Pt pain well controlled with epidural.  Second large bore IV started by RN with fluids running.  Stat CBC and DIC panel ordered.  EBL 920 ml by weight.  FHR continues to be category I.  Dr Constant in OR, notified and stat cesarean called. Briefly discussed cesarean with pt who verbally consents.  Pt prepped for OR.  Dr Royce Macadamia to bedside to dose epidural.  Fatima Blank 05/01/2019, 12:04 PM

## 2019-05-01 NOTE — Anesthesia Postprocedure Evaluation (Signed)
Anesthesia Post Note  Patient: Nancy Grimes  Procedure(s) Performed: CESAREAN SECTION (N/A )     Patient location during evaluation: PACU Anesthesia Type: Epidural Level of consciousness: oriented and awake and alert Pain management: pain level controlled Vital Signs Assessment: post-procedure vital signs reviewed and stable Respiratory status: spontaneous breathing, respiratory function stable and nonlabored ventilation Cardiovascular status: blood pressure returned to baseline and stable Postop Assessment: no headache, no backache, no apparent nausea or vomiting, spinal receding and patient able to bend at knees Anesthetic complications: no    Last Vitals:  Vitals:   05/01/19 1415 05/01/19 1420  BP: (!) 107/54 110/60  Pulse: 97 86  Resp: 15 17  Temp: (!) 36.2 C   SpO2: 100% 100%    Last Pain:  Vitals:   05/01/19 1415  TempSrc: Axillary  PainSc: 0-No pain   Pain Goal:    LLE Motor Response: Purposeful movement (05/01/19 1415) LLE Sensation: Numbness (05/01/19 1415) RLE Motor Response: Purposeful movement (05/01/19 1415) RLE Sensation: Numbness (05/01/19 1415)     Epidural/Spinal Function Cutaneous sensation: Tingles (05/01/19 1415), Patient able to flex knees: No (05/01/19 1415), Patient able to lift hips off bed: No (05/01/19 1415), Back pain beyond tenderness at insertion site: No (05/01/19 1415), Progressively worsening motor and/or sensory loss: No (05/01/19 1415), Bowel and/or bladder incontinence post epidural: No (05/01/19 1415)  Agness Sibrian A.

## 2019-05-01 NOTE — Op Note (Addendum)
Nancy Grimes PROCEDURE DATE: 05/01/2019  PREOPERATIVE DIAGNOSIS: Intrauterine pregnancy at  [redacted]w[redacted]d weeks gestation; significant bright red vaginal bleeding and NRFHT  POSTOPERATIVE DIAGNOSIS: The same  PROCEDURE:     Cesarean Section  SURGEON:  Dr. Mora Bellman  ASSISTANT: none  INDICATIONS: Nancy Grimes is a 28 y.o. [id number] at [redacted]w[redacted]d scheduled for cesarean section secondary to abruptio placenta and non-reassuring fetal status.  Patient with EBL 900 in L& D prior to delivery. The risks of cesarean section discussed with the patient included but were not limited to: bleeding which may require transfusion or reoperation; infection which may require antibiotics; injury to bowel, bladder, ureters or other surrounding organs; injury to the fetus; need for additional procedures including hysterectomy in the event of a life-threatening hemorrhage; placental abnormalities wth subsequent pregnancies, incisional problems, thromboembolic phenomenon and other postoperative/anesthesia complications. The patient concurred with the proposed plan, giving informed written consent for the procedure.    FINDINGS:  Viable female infant in cephalic presentation.  Apgars 8 and 8.  Meconium stained amniotic fluid.  Intact placenta with a 10 cm clot delivered prior to infant, three vessel cord.  Normal uterus, fallopian tubes and ovaries bilaterally.  ANESTHESIA:    Spinal INTRAVENOUS FLUIDS:800  Ml LR and 500 ml albumin ESTIMATED BLOOD LOSS: 320 ml URINE OUTPUT:  250 ml SPECIMENS: Placenta sent to pathology COMPLICATIONS: None immediate  PROCEDURE IN DETAIL:  The patient received intravenous antibiotics and had sequential compression devices applied to her lower extremities while in the preoperative area.  She was then taken to the operating room where anesthesia was induced and was found to be adequate. A foley catheter was placed into her bladder and attached to Nancy Grimes gravity. She was then placed in a  dorsal supine position with a leftward tilt, and prepped and draped in a sterile manner. After an adequate timeout was performed, a Pfannenstiel skin incision was made with scalpel and carried through to the underlying layer of fascia. The fascia was incised in the midline and this incision was extended bilaterally using the Mayo scissors. Kocher clamps were applied to the superior aspect of the fascial incision and the underlying rectus muscles were dissected off bluntly. A similar process was carried out on the inferior aspect of the facial incision. The rectus muscles were separated in the midline bluntly and the peritoneum was entered bluntly. The Alexis self-retaining retractor was introduced into the abdominal cavity. Attention was turned to the lower uterine segment where a  transverse hysterotomy was made with a scalpel and extended bilaterally bluntly. The infant was successfully delivered, and cord was clamped and cut and infant was handed over to awaiting neonatology team. Uterine massage was then administered and the placenta delivered intact with three-vessel cord. The uterus was cleared of clot and debris.  The hysterotomy was closed with 0 Vicryl in a running locked fashion, and an imbricating layer was also placed with a 0 Vicryl. Overall, excellent hemostasis was noted. The pelvis copiously irrigated and cleared of all clot and debris. Hemostasis was confirmed on all surfaces.  The peritoneum and the muscles were reapproximated using 0 vicryl interrupted stitches. The fascia was then closed using 0 Vicryl in a running fashion.  The skin was closed in a subcuticular fashion using 3.0 Vicryl. The patient tolerated the procedure well. Sponge, lap, instrument and needle counts were correct x 2. She was taken to the recovery room in stable condition.    Linkoln Alkire ConstantMD  05/01/2019 12:45 PM   Addendum  Due to the emergent nature surrounding the delivery, the patient was not consented for a permanent  sterilization. Prenatal records indicated that medicaid form was signed on 04/15/19 but is not visible in Epic.

## 2019-05-01 NOTE — Lactation Note (Signed)
This note was copied from a baby's chart. Lactation Consultation Note  Patient Name: Nancy Grimes IWOEH'O Date: 05/01/2019 Reason for consult: Initial assessment;Term P4, 9 hour term infant ,LGA infant greater than 9 lbs, mom with  C/S delivery and CHTN.  Infant had 2 voids and 2 stools since delivery. Mom doesn't have a breast pump at home, [facility] gave harmony hand pump prn. Mom shown how to use hand pump & how to disassemble, clean, & reassemble parts and mom fitted with 27 mm breast flange. Mom is experienced at breastfeeding she breast feed her 1st child for one year and her 2nd& 3rd child for 6 months due to returning to work. Per mom, breastfeeding is going well, infant has been latching 20 to 30 minutes most feeding. Per parents, infant last breastfed for 30 minutes at 8:30 pm LC did not observe a latch at this time. Mom will breastfeed infant according hunger cues, 8 to 12 times within 24 hours and on demand. Reviewed Baby & Me book's Breastfeeding Basics.  Mom made aware of O/P services, breastfeeding support groups, community resources, and our phone # for post-discharge questions.     Maternal Data Formula Feeding for Exclusion: No Has patient been taught Hand Expression?: Yes Does the patient have breastfeeding experience prior to this delivery?: Yes  Feeding Feeding Type: Breast Fed  LATCH Score Latch: Repeated attempts needed to sustain latch, nipple held in mouth throughout feeding, stimulation needed to elicit sucking reflex.  Audible Swallowing: A few with stimulation  Type of Nipple: Everted at rest and after stimulation  Comfort (Breast/Nipple): Soft / non-tender  Hold (Positioning): Assistance needed to correctly position infant at breast and maintain latch.  LATCH Score: 7  Interventions Interventions: Breast feeding basics reviewed;Hand pump  Lactation Tools Discussed/Used WIC Program: No Pump Review: Setup, frequency, and cleaning Initiated by::  Vicente Serene, IBCLC Date initiated:: 05/02/19   Consult Status Consult Status: Follow-up Date: 05/02/19 Follow-up type: In-patient    Vicente Serene 05/01/2019, 9:40 PM

## 2019-05-01 NOTE — MAU Note (Signed)
Patient presents to MAU c/o ctx 5-6 mins apart.  +FM, denies LOF and vaginal bleeding.  Cervical check in office 9/18 patient 4cm dilated.

## 2019-05-01 NOTE — Progress Notes (Signed)
10 Instruments 5 9*9 5 4*18 2 Injectable

## 2019-05-02 DIAGNOSIS — O4590 Premature separation of placenta, unspecified, unspecified trimester: Secondary | ICD-10-CM

## 2019-05-02 LAB — CBC
HCT: 23.1 % — ABNORMAL LOW (ref 36.0–46.0)
Hemoglobin: 7.5 g/dL — ABNORMAL LOW (ref 12.0–15.0)
MCH: 28.3 pg (ref 26.0–34.0)
MCHC: 32.5 g/dL (ref 30.0–36.0)
MCV: 87.2 fL (ref 80.0–100.0)
Platelets: 234 10*3/uL (ref 150–400)
RBC: 2.65 MIL/uL — ABNORMAL LOW (ref 3.87–5.11)
RDW: 18.3 % — ABNORMAL HIGH (ref 11.5–15.5)
WBC: 13.8 10*3/uL — ABNORMAL HIGH (ref 4.0–10.5)
nRBC: 0 % (ref 0.0–0.2)

## 2019-05-02 MED ORDER — FERROUS SULFATE 325 (65 FE) MG PO TABS
325.0000 mg | ORAL_TABLET | Freq: Every day | ORAL | Status: DC
  Administered 2019-05-03: 08:00:00 325 mg via ORAL
  Filled 2019-05-02: qty 1

## 2019-05-02 MED ORDER — IBUPROFEN 600 MG PO TABS
600.0000 mg | ORAL_TABLET | Freq: Four times a day (QID) | ORAL | Status: DC
  Administered 2019-05-02 – 2019-05-03 (×5): 600 mg via ORAL
  Filled 2019-05-02 (×5): qty 1

## 2019-05-02 NOTE — Discharge Instructions (Signed)

## 2019-05-02 NOTE — Progress Notes (Signed)
Subjective: Postpartum Day 1: Cesarean Delivery  Nancy Grimes is a 28 y.o [id number], POD#1 following a pLTCS at [redacted]w[redacted]d for an abruption. She reports her pain is managed and denies any current pain. She is up as lib but does notes some lightheadedness with ambulation. She is tolerating PO, denies any nausea or vomiting. No bowel movement at this time but positive flatus. She reports not voided since foley was removed at 0600. She denies shortness of breath, chest pain, headache, or visual changes.   Objective: Vital signs in last 24 hours: Temp:  [96.6 F (35.9 C)-98.4 F (36.9 C)] 97.9 F (36.6 C) (09/21 0859) Pulse Rate:  [57-103] 57 (09/21 0859) Resp:  [10-37] 16 (09/21 0859) BP: (103-123)/(54-94) 106/60 (09/21 0859) SpO2:  [93 %-100 %] 100 % (09/21 0859)  Physical Exam:  Upon entering room patient was resting and woke to verbal stimuli.  She was very drowsy during exam.  General: cooperative, appears stated age, fatigued, no distress and pale Lochia: appropriate Uterine Fundus: firm Incision: pressure dressing applied, clean, dry, and intact DVT Evaluation: No significant calf/ankle edema. Right posterior calf tenderness present with palpation   Cardiac: Regular rate and rhythm  Pulmonary: Clear to ascultation bilaterally  Abdomen: soft  Recent Labs    05/01/19 1145 05/02/19 0457  HGB 9.6* 7.5*  HCT 29.7* 23.1*    Assessment/Plan: Nancy Grimes is a 28 y.o [id number], POD#2 following a pLTCS at [redacted]w[redacted]d for an abruption.   Repeat CBC to monitor hemoglobin  Daily iron supplement   Breastfeeding, outpatient circumcision  MOC: BLT, signed consent on 9/4  Dispo: Continue current care  Placido Sou PA-S  05/02/2019, 9:13 AM  I saw and evaluated the patient. I agree with the findings and the plan of care as documented in the student's note. Now planning of partner vasectomy; confirm 9/22. Outpatient circumcision. Possible DC on 9/22. Repeat CBC AM. Begin daily  iron.  Barrington Ellison, MD [facility] Family Medicine Fellow, [facility] for [employer], [place]

## 2019-05-02 NOTE — Lactation Note (Signed)
This note was copied from a baby's chart. Lactation Consultation Note  Patient Name: Nancy Grimes PJKDT'O Date: 05/02/2019 Reason for consult: Follow-up assessment  P4 mother whose infant is now 74 hours old.  Mother breast fed her first child for one year and her second and third children for 6 months each.  Baby was asleep in the bassinet when I arrived.  Mother had no questions/concerns related to breast feeding.  She has been feeding on cue and baby has latched well.  Mother denies pain with feedings and baby is content and sleepy after feedings.  Mother is familiar with hand expression and has been able to express colostrum drops.  Colostrum container provided and milk storage times reviewed.  Finger feeding demonstrated.  Suggested mother continue to do hand expression before/after feedings to help increase milk supply.    Mother is talking about the possibility of being released today.  She has some child care issues that would benefit her if she were home.  I suggested she speak with her RN/MD about this since she is just 23 hours after a cesarean delivery.    Mother does not have private insurance nor is she a [facility] participant.  I suggested she call the [facility] office when I leave the room to see if she can qualify over the phone.  [place] referral to [facility] faxed.  She will be a "stay at home" mother.  Mother will call for any questions/concerns or if she desires latch assistance.  Father present.  RN updated.   Maternal Data    Feeding    LATCH Score                   Interventions    Lactation Tools Discussed/Used     Consult Status Consult Status: Follow-up Date: 05/03/19 Follow-up type: In-patient    [name] 05/02/2019, 12:39 PM
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
## 2019-05-02 NOTE — Discharge Summary (Addendum)
Postpartum Discharge Summary     Patient Name: Nancy Grimes DOB: 1990-10-14 MRN: [id number]  Date of admission: 05/01/2019 Delivering Provider: CONSTANT, PEGGY   Date of discharge: 05/03/2019  Admitting diagnosis: 41wks, contractions Intrauterine pregnancy: [redacted]w[redacted]d    Secondary diagnosis:  Active Problems:   Positive GBS test   Labor and delivery, indication for care   Placental abruption affecting delivery  Additional problems: None     Discharge diagnosis: Term Pregnancy Delivered                                                                                                Post partum procedures:None  Augmentation: Pitocin  Complications: Placental Abruption and Hemorrhage>10050m Hospital course:  Onset of Labor With Unplanned C/S  [age].o. yo [id number] 4177w1ds admitted in [place] 05/01/2019. Patient had a labor course significant for: patient was admitted at 5 cm and augmented with pitocin. During labor patient had large amount of bleeding noted with multiple large clots passed concerning for abruption. 952cc of blood prior to OR. Membrane Rupture Time/Date: 12:10 PM ,05/01/2019   The patient went for cesarean section due to placental abruption and NRFHT, and delivered a Viable infant,05/01/2019  Details of operation can be found in separate operative note. Patient had an uncomplicated postpartum course. Her partner is to have a vasectomy and she will receive Depo in clinic for interim birth control. Cont ferrous sulfate on discharge. She is ambulating,tolerating a regular diet, passing flatus, and urinating well.  Patient is discharged home in stable condition 05/03/19. Delivery time: 12:11 PM    Magnesium Sulfate received: No BMZ received: No Rhophylac:No MMR:N/A Transfusion:No  Physical exam  Vitals:   05/02/19 0859 05/02/19 1507 05/02/19 2012 05/03/19 0501  BP: 106/60 130/67 123/60 116/70  Pulse: (!) 57 83 76 72  Resp: _0 Temp: 97.9 F (36.6  C) 98.3 F (36.8 C) 98.3 F (36.8 C) 98.4 F (36.9 C)  TempSrc: Oral Oral Oral Oral  SpO2: 100% 98% 100% 100%  Weight:      Height:       General: alert, cooperative and no distress Lochia: appropriate Uterine Fundus: firm Incision: Healing well with no significant drainage, No significant erythema, Dressing is clean, dry, and intact DVT Evaluation: No evidence of DVT seen on physical exam. Labs: Lab Results  Component Value Date   WBC 8.9 05/03/2019   HGB 7.3 (L) 05/03/2019   HCT 23.4 (L) 05/03/2019   MCV 89.3 05/03/2019   PLT 238 05/03/2019   CMP Latest Ref Rng & Units 05/01/2019  Glucose 70 - 99 mg/dL -  BUN 6 - 20 mg/dL -  Creatinine 0.44 - 1.00 mg/dL 0.49  Sodium 135 - 145 mmol/L -  Potassium 3.5 - 5.1 mmol/L -  Chloride 98 - 111 mmol/L -  CO2 22 - 32 mmol/L -  Calcium 8.9 - 10.3 mg/dL -  Total Protein 6.5 - 8.1 g/dL -  Total Bilirubin 0.3 - 1.2 mg/dL -  Alkaline Phos 38 - 126 U/L -  AST 15 - 41 U/L -  ALT 0 - 44 U/L -    Discharge instruction: per After Visit Summary and "Baby and Me Booklet".  After visit meds:  Allergies as of 05/03/2019      Reactions   Salicylic Acid Rash   Acne Medications   Penicillin G Swelling   Did it involve swelling of the face/tongue/throat, SOB, or low BP? Unknown Did it involve sudden or severe rash/hives, skin peeling, or any reaction on the inside of your mouth or nose? Unknown Did you need to seek medical attention at a hospital or doctor's office? Unknown When did it last happen?childhood If all above answers are "NO", may proceed with cephalosporin use.      Medication List    STOP taking these medications   acetaminophen 325 MG tablet Commonly known as: TYLENOL     TAKE these medications   ferrous sulfate 325 (65 FE) MG tablet Take 1 tablet (325 mg total) by mouth every other day.   ibuprofen 600 MG tablet Commonly known as: ADVIL Take 1 tablet (600 mg total) by mouth 4 (four) times daily.    multivitamin-prenatal 27-0.8 MG Tabs tablet Take 1 tablet by mouth daily at 12 noon.   oxyCODONE-acetaminophen 5-325 MG tablet Commonly known as: PERCOCET/ROXICET Take 1-2 tablets by mouth every 4 (four) hours as needed for moderate pain.   polyethylene glycol powder 17 GM/SCOOP powder Commonly known as: GLYCOLAX/MIRALAX Take 255 g by mouth daily.       Diet: routine diet  Activity: Advance as tolerated. Pelvic rest for 6 weeks.   Outpatient follow up:2 weeks Follow up Appt:No future appointments. Follow up Visit:   Please schedule this patient for Postpartum visit in: 4 weeks with the following provider: MD For C/S patients schedule nurse incision check in weeks 2 weeks: yes Low risk pregnancy complicated by: n/a Delivery mode:  CS Anticipated Birth Control:  Plans Interval BTL PP Procedures needed: Incision check  Schedule Integrated BH visit: no      Newborn Data: Live born female  Birth Weight: 9 lb 3.3 oz (4175 g) APGAR: 8, 8  Newborn Delivery   Birth date/time: 05/01/2019 12:11:00 Delivery type: C-Section, Low Transverse Trial of labor: Yes C-section categorization: Primary      Baby Feeding: Breast Disposition:home with mother   05/03/2019 Chauncey Mann, MD

## 2019-05-03 LAB — CBC
HCT: 23.4 % — ABNORMAL LOW (ref 36.0–46.0)
Hemoglobin: 7.3 g/dL — ABNORMAL LOW (ref 12.0–15.0)
MCH: 27.9 pg (ref 26.0–34.0)
MCHC: 31.2 g/dL (ref 30.0–36.0)
MCV: 89.3 fL (ref 80.0–100.0)
Platelets: 238 10*3/uL (ref 150–400)
RBC: 2.62 MIL/uL — ABNORMAL LOW (ref 3.87–5.11)
RDW: 18.6 % — ABNORMAL HIGH (ref 11.5–15.5)
WBC: 8.9 10*3/uL (ref 4.0–10.5)
nRBC: 0 % (ref 0.0–0.2)

## 2019-05-03 LAB — SURGICAL PATHOLOGY

## 2019-05-03 MED ORDER — OXYCODONE-ACETAMINOPHEN 5-325 MG PO TABS: 1.0000 | ORAL_TABLET | ORAL | 0 refills | Status: DC | PRN

## 2019-05-03 MED ORDER — FERROUS SULFATE 325 (65 FE) MG PO TABS: 325.0000 mg | ORAL_TABLET | ORAL | 1 refills | Status: DC

## 2019-05-03 MED ORDER — IBUPROFEN 600 MG PO TABS: 600.0000 mg | ORAL_TABLET | Freq: Four times a day (QID) | ORAL | 0 refills | Status: DC

## 2019-05-03 MED ORDER — POLYETHYLENE GLYCOL 3350 17 GM/SCOOP PO POWD: 1.0000 | Freq: Every day | ORAL | 0 refills | Status: DC

## 2019-05-03 NOTE — Lactation Note (Signed)
This note was copied from a baby's chart. Lactation Consultation Note  Patient Name: Boy Jaylanni Eltringham DGLOV'F Date: 05/03/2019 Reason for consult: Follow-up assessment;Term  LC in to visit with P4 Mom of term baby at 40 hrs old.  Baby at 6% weight loss and latching well to breast.  Mom denies needing any help, or having any questions.  Baby sleeping swaddled in crib and Mom trying to rest.  Encouraged continued STS and offering breast with any cues.  Goal is >8 feedings per 24 hrs.    Engorgement prevention and treatment reviewed.  Mom aware of OP lactation support available to her and encouraged to call prn.   Consult Status Consult Status: Complete Date: 05/03/19 Follow-up type: Call as needed    Broadus John 05/03/2019, 10:17 AM

## 2019-05-05 LAB — TYPE AND SCREEN
ABO/RH(D): A NEG
Antibody Screen: POSITIVE
Unit division: 0
Unit division: 0

## 2019-05-05 LAB — BPAM RBC
Blood Product Expiration Date: 202010222359
Blood Product Expiration Date: 202010222359
Unit Type and Rh: 600
Unit Type and Rh: 600

## 2019-05-19 ENCOUNTER — Other Ambulatory Visit: Payer: Self-pay | Admitting: Obstetrics & Gynecology

## 2019-05-19 ENCOUNTER — Telehealth: Payer: Self-pay | Admitting: Obstetrics & Gynecology

## 2019-05-19 NOTE — Telephone Encounter (Signed)
Patient is schedule for 06/02/19 with [facility]

## 2019-05-19 NOTE — Telephone Encounter (Signed)
-----   Message from Gae Dry, MD sent at 05/19/2019 11:25 AM EDT ----- Regarding: appt Sch Postpartum appt, will need so can also schedule Tubal Surgery. Sch week of Oct 19 She delivered at [facility], so any MD provider she wants for [facility] check

## 2019-06-02 ENCOUNTER — Other Ambulatory Visit: Payer: Self-pay

## 2019-06-02 ENCOUNTER — Encounter: Payer: Self-pay | Admitting: Obstetrics & Gynecology

## 2019-06-02 ENCOUNTER — Ambulatory Visit (INDEPENDENT_AMBULATORY_CARE_PROVIDER_SITE_OTHER): Payer: Medicaid Other | Admitting: Obstetrics & Gynecology

## 2019-06-02 DIAGNOSIS — Z1389 Encounter for screening for other disorder: Secondary | ICD-10-CM

## 2019-06-02 MED ORDER — MEDROXYPROGESTERONE ACETATE 150 MG/ML IM SUSP: 150.0000 mg | INTRAMUSCULAR | 4 refills | Status: DC

## 2019-06-02 NOTE — Patient Instructions (Signed)
Medroxyprogesterone injection [Contraceptive] What is this medicine? MEDROXYPROGESTERONE (me DROX ee proe JES te rone) contraceptive injections prevent pregnancy. They provide effective birth control for 3 months. Depo-subQ Provera 104 is also used for treating pain related to endometriosis. This medicine may be used for other purposes; ask your health care provider or pharmacist if you have questions. COMMON BRAND NAME(S): Depo-Provera, Depo-subQ Provera 104 What should I tell my health care provider before I take this medicine? They need to know if you have any of these conditions:  frequently drink alcohol  asthma  blood vessel disease or a history of a blood clot in the lungs or legs  bone disease such as osteoporosis  breast cancer  diabetes  eating disorder (anorexia nervosa or bulimia)  high blood pressure  HIV infection or AIDS  kidney disease  liver disease  mental depression  migraine  seizures (convulsions)  stroke  tobacco smoker  vaginal bleeding  an unusual or allergic reaction to medroxyprogesterone, other hormones, medicines, foods, dyes, or preservatives  pregnant or trying to get pregnant  breast-feeding How should I use this medicine? Depo-Provera Contraceptive injection is given into a muscle. Depo-subQ Provera 104 injection is given under the skin. These injections are given by a health care professional. You must not be pregnant before getting an injection. The injection is usually given during the first 5 days after the start of a menstrual period or 6 weeks after delivery of a baby. Talk to your pediatrician regarding the use of this medicine in children. Special care may be needed. These injections have been used in female children who have started having menstrual periods. Overdosage: If you think you have taken too much of this medicine contact a poison control center or emergency room at once. NOTE: This medicine is only for you. Do not  share this medicine with others. What if I miss a dose? Try not to miss a dose. You must get an injection once every 3 months to maintain birth control. If you cannot keep an appointment, call and reschedule it. If you wait longer than 13 weeks between Depo-Provera contraceptive injections or longer than 14 weeks between Depo-subQ Provera 104 injections, you could get pregnant. Use another method for birth control if you miss your appointment. You may also need a pregnancy test before receiving another injection. What may interact with this medicine? Do not take this medicine with any of the following medications:  bosentan This medicine may also interact with the following medications:  aminoglutethimide  antibiotics or medicines for infections, especially rifampin, rifabutin, rifapentine, and griseofulvin  aprepitant  barbiturate medicines such as phenobarbital or primidone  bexarotene  carbamazepine  medicines for seizures like ethotoin, felbamate, oxcarbazepine, phenytoin, topiramate  modafinil  St. John's wort This list may not describe all possible interactions. Give your health care provider a list of all the medicines, herbs, non-prescription drugs, or dietary supplements you use. Also tell them if you smoke, drink alcohol, or use illegal drugs. Some items may interact with your medicine. What should I watch for while using this medicine? This drug does not protect you against HIV infection (AIDS) or other sexually transmitted diseases. Use of this product may cause you to lose calcium from your bones. Loss of calcium may cause weak bones (osteoporosis). Only use this product for more than 2 years if other forms of birth control are not right for you. The longer you use this product for birth control the more likely you will be at risk   for weak bones. Ask your health care professional how you can keep strong bones. You may have a change in bleeding pattern or irregular periods.  Many females stop having periods while taking this drug. If you have received your injections on time, your chance of being pregnant is very low. If you think you may be pregnant, see your health care professional as soon as possible. Tell your health care professional if you want to get pregnant within the next year. The effect of this medicine may last a long time after you get your last injection. What side effects may I notice from receiving this medicine? Side effects that you should report to your doctor or health care professional as soon as possible:  allergic reactions like skin rash, itching or hives, swelling of the face, lips, or tongue  breast tenderness or discharge  breathing problems  changes in vision  depression  feeling faint or lightheaded, falls  fever  pain in the abdomen, chest, groin, or leg  problems with balance, talking, walking  unusually weak or tired  yellowing of the eyes or skin Side effects that usually do not require medical attention (report to your doctor or health care professional if they continue or are bothersome):  acne  fluid retention and swelling  headache  irregular periods, spotting, or absent periods  temporary pain, itching, or skin reaction at site where injected  weight gain This list may not describe all possible side effects. Call your doctor for medical advice about side effects. You may report side effects to FDA at 1-800-FDA-1088. Where should I keep my medicine? This does not apply. The injection will be given to you by a health care professional. NOTE: This sheet is a summary. It may not cover all possible information. If you have questions about this medicine, talk to your doctor, pharmacist, or health care provider.  2020 Elsevier/Gold Standard (2008-08-18 18:37:56)  

## 2019-06-02 NOTE — Progress Notes (Signed)
  OBSTETRICS POSTPARTUM CLINIC PROGRESS NOTE  Subjective:     Nancy Grimes is a 28 y.o. G2B6389 female who presents for a postpartum visit. She is 6 weeks postpartum following a Term pregnancy and delivery by C-section emergent.  Abruption. At [facility]. I have fully reviewed the prenatal and intrapartum course. Anesthesia: epidural.  Postpartum course has been complicated by uncomplicated.  Baby is feeding by Breast.  Bleeding: patient has not  resumed menses.  Bowel function is normal. Bladder function is normal.  Patient is not sexually active. Contraception method desired is Depo-Provera injections.  Postpartum depression screening: negative. Edinburgh 6.  The following portions of the patient's history were reviewed and updated as appropriate: allergies, current medications, past family history, past medical history, past social history, past surgical history and problem list.  Review of Systems Pertinent items are noted in HPI.  Objective:    BP 120/80   Ht 5\' 4"  (1.626 m)   Wt 192 lb (87.1 kg)   BMI 32.96 kg/m   General:  alert and no distress   Breasts:  inspection negative, no nipple discharge or bleeding, no masses or nodularity palpable  Lungs: clear to auscultation bilaterally  Heart:  regular rate and rhythm, S1, S2 normal, no murmur, click, rub or gallop  Abdomen: soft, non-tender; bowel sounds normal; no masses,  no organomegaly.   Well healed Pfannenstiel incision   Vulva:  normal  Vagina: normal vagina, no discharge, exudate, lesion, or erythema  Cervix:  no cervical motion tenderness and no lesions  Corpus: normal size, contour, position, consistency, mobility, non-tender  Adnexa:  normal adnexa and no mass, fullness, tenderness  Rectal Exam: Not performed.          Assessment:  Post Partum Care visit 1. Postpartum care following cesarean delivery   Plan:  See orders and Patient Instructions Plans Depo for birth control, considering Vasec vx  Lap BTL Follow up in: 3 months for PAP and second Depo shot; or as needed.   Barnett Applebaum, MD, [facility], [facility] 06/02/2019  11:22 AM

## 2019-06-03 ENCOUNTER — Ambulatory Visit (INDEPENDENT_AMBULATORY_CARE_PROVIDER_SITE_OTHER): Payer: Medicaid Other

## 2019-06-03 DIAGNOSIS — Z30013 Encounter for initial prescription of injectable contraceptive: Secondary | ICD-10-CM

## 2019-06-03 MED ORDER — MEDROXYPROGESTERONE ACETATE 150 MG/ML IM SUSP
150.0000 mg | Freq: Once | INTRAMUSCULAR | Status: AC
  Administered 2019-06-03: 11:00:00 150 mg via INTRAMUSCULAR

## 2019-06-03 NOTE — Patient Instructions (Signed)
Pt here for first Depo Provera injection after delivery. Requested in her deltoid. Given in Rt Deltoid, tolerated well

## 2019-07-25 DIAGNOSIS — Z5181 Encounter for therapeutic drug level monitoring: Secondary | ICD-10-CM | POA: Diagnosis not present

## 2019-08-16 DIAGNOSIS — Z5181 Encounter for therapeutic drug level monitoring: Secondary | ICD-10-CM | POA: Diagnosis not present

## 2019-08-23 DIAGNOSIS — Z5181 Encounter for therapeutic drug level monitoring: Secondary | ICD-10-CM | POA: Diagnosis not present

## 2019-08-26 ENCOUNTER — Ambulatory Visit (INDEPENDENT_AMBULATORY_CARE_PROVIDER_SITE_OTHER): Payer: Medicaid Other | Admitting: Obstetrics & Gynecology

## 2019-08-26 ENCOUNTER — Encounter: Payer: Self-pay | Admitting: Obstetrics & Gynecology

## 2019-08-26 ENCOUNTER — Other Ambulatory Visit (HOSPITAL_COMMUNITY)
Admission: RE | Admit: 2019-08-26 | Discharge: 2019-08-26 | Disposition: A | Discharge status: Home / Self Care | Payer: Medicaid Other | Source: Ambulatory Visit | Attending: Obstetrics & Gynecology | Admitting: Obstetrics & Gynecology

## 2019-08-26 ENCOUNTER — Other Ambulatory Visit: Payer: Self-pay

## 2019-08-26 VITALS — BP 140/80 | Ht 65.0 in | Wt 199.0 lb

## 2019-08-26 DIAGNOSIS — Z0000 Encounter for general adult medical examination without abnormal findings: Secondary | ICD-10-CM | POA: Diagnosis not present

## 2019-08-26 DIAGNOSIS — Z124 Encounter for screening for malignant neoplasm of cervix: Secondary | ICD-10-CM | POA: Insufficient documentation

## 2019-08-26 DIAGNOSIS — Z30017 Encounter for initial prescription of implantable subdermal contraceptive: Secondary | ICD-10-CM | POA: Diagnosis not present

## 2019-08-26 DIAGNOSIS — Z01419 Encounter for gynecological examination (general) (routine) without abnormal findings: Secondary | ICD-10-CM

## 2019-08-26 DIAGNOSIS — E010 Iodine-deficiency related diffuse (endemic) goiter: Secondary | ICD-10-CM | POA: Diagnosis not present

## 2019-08-26 NOTE — Patient Instructions (Signed)
Etonogestrel implant What is this medicine? ETONOGESTREL (et oh noe JES trel) is a contraceptive (birth control) device. It is used to prevent pregnancy. It can be used for up to 3 years. This medicine may be used for other purposes; ask your health care provider or pharmacist if you have questions. COMMON BRAND NAME(S): Implanon, Nexplanon What should I tell my health care provider before I take this medicine? They need to know if you have any of these conditions:  abnormal vaginal bleeding  blood vessel disease or blood clots  breast, cervical, endometrial, ovarian, liver, or uterine cancer  diabetes  gallbladder disease  heart disease or recent heart attack  high blood pressure  high cholesterol or triglycerides  kidney disease  liver disease  migraine headaches  seizures  stroke  tobacco smoker  an unusual or allergic reaction to etonogestrel, anesthetics or antiseptics, other medicines, foods, dyes, or preservatives  pregnant or trying to get pregnant  breast-feeding How should I use this medicine? This device is inserted just under the skin on the inner side of your upper arm by a health care professional. Talk to your pediatrician regarding the use of this medicine in children. Special care may be needed. Overdosage: If you think you have taken too much of this medicine contact a poison control center or emergency room at once. NOTE: This medicine is only for you. Do not share this medicine with others. What if I miss a dose? This does not apply. What may interact with this medicine? Do not take this medicine with any of the following medications:  amprenavir  fosamprenavir This medicine may also interact with the following medications:  acitretin  aprepitant  armodafinil  bexarotene  bosentan  carbamazepine  certain medicines for fungal infections like fluconazole, ketoconazole, itraconazole and voriconazole  certain medicines to treat  hepatitis, HIV or AIDS  cyclosporine  felbamate  griseofulvin  lamotrigine  modafinil  oxcarbazepine  phenobarbital  phenytoin  primidone  rifabutin  rifampin  rifapentine  St. John's wort  topiramate This list may not describe all possible interactions. Give your health care provider a list of all the medicines, herbs, non-prescription drugs, or dietary supplements you use. Also tell them if you smoke, drink alcohol, or use illegal drugs. Some items may interact with your medicine. What should I watch for while using this medicine? This product does not protect you against HIV infection (AIDS) or other sexually transmitted diseases. You should be able to feel the implant by pressing your fingertips over the skin where it was inserted. Contact your doctor if you cannot feel the implant, and use a non-hormonal birth control method (such as condoms) until your doctor confirms that the implant is in place. Contact your doctor if you think that the implant may have broken or become bent while in your arm. You will receive a user card from your health care provider after the implant is inserted. The card is a record of the location of the implant in your upper arm and when it should be removed. Keep this card with your health records. What side effects may I notice from receiving this medicine? Side effects that you should report to your doctor or health care professional as soon as possible:  allergic reactions like skin rash, itching or hives, swelling of the face, lips, or tongue  breast lumps, breast tissue changes, or discharge  breathing problems  changes in emotions or moods  coughing up blood  if you feel that the implant   may have broken or bent while in your arm  high blood pressure  pain, irritation, swelling, or bruising at the insertion site  scar at site of insertion  signs of infection at the insertion site such as fever, and skin redness, pain or  discharge  signs and symptoms of a blood clot such as breathing problems; changes in vision; chest pain; severe, sudden headache; pain, swelling, warmth in the leg; trouble speaking; sudden numbness or weakness of the face, arm or leg  signs and symptoms of liver injury like dark yellow or brown urine; general ill feeling or flu-like symptoms; light-colored stools; loss of appetite; nausea; right upper belly pain; unusually weak or tired; yellowing of the eyes or skin  unusual vaginal bleeding, discharge Side effects that usually do not require medical attention (report to your doctor or health care professional if they continue or are bothersome):  acne  breast pain or tenderness  headache  irregular menstrual bleeding  nausea This list may not describe all possible side effects. Call your doctor for medical advice about side effects. You may report side effects to FDA at 1-800-FDA-1088. Where should I keep my medicine? This drug is given in a hospital or clinic and will not be stored at home. NOTE: This sheet is a summary. It may not cover all possible information. If you have questions about this medicine, talk to your doctor, pharmacist, or health care provider.  2020 Elsevier/Gold Standard (2019-05-10 11:33:04)  

## 2019-08-26 NOTE — Progress Notes (Signed)
HPI:      [name] is a 29 y.o. [id number] who LMP was No LMP recorded., she presents today for her annual examination. The patient has no complaints today. The patient is sexually active. Her last pap: approximate date 2019 and was normal. The patient does perform self breast exams.  There is no notable family history of breast or ovarian cancer in her family.  The patient has regular exercise: yes.  The patient denies current symptoms of depression.    GYN History: Contraception: Depo-Provera injections  PMHx: Past Medical History:  Diagnosis Date  . Anemia   . Chronic headaches   . Miscarriage   . Rh negative state in antepartum period, third trimester    Past Surgical History:  Procedure Laterality Date  . ARM SURGERY  2007   RIGHT ELBOW  . CESAREAN SECTION N/A 05/01/2019   Procedure: CESAREAN SECTION;  Surgeon: Catalina Antigua, MD;  Location: [facility];  Service: Obstetrics;  Laterality: N/A;  . VAGINAL DELIVERY     x3   Family History  Problem Relation Age of Onset  . Hypertension Father   . Hyperlipidemia Father   . ADD / ADHD Father   . Diabetes Paternal Grandmother        type2  . Thyroid disease Cousin   . Hyperlipidemia Maternal Grandmother   . Heart disease Maternal Grandmother   . Heart disease Maternal Grandfather   . Hyperlipidemia Maternal Grandfather   . Stroke Maternal Grandfather   . Diabetes Paternal Uncle    Social History   Tobacco Use  . Smoking status: Former Smoker    Packs/day: 0.25    Years: 0.50    Pack years: 0.12    Quit date: 2014    Years since quitting: 7.0  . Smokeless tobacco: Never Used  Substance Use Topics  . Alcohol use: No    Comment: occasionally-half a glass a week  . Drug use: No    Current Outpatient Medications:  .  polyethylene glycol powder (GLYCOLAX/MIRALAX) 17 GM/SCOOP powder, Take 255 g by mouth daily. (Patient not taking: Reported on 06/02/2019), Disp: 255 g, Rfl: 0 .  Prenatal Vit-Fe Fumarate-FA  (MULTIVITAMIN-PRENATAL) 27-0.8 MG TABS tablet, Take 1 tablet by mouth daily at 12 noon., Disp: , Rfl:  Allergies: Salicylic acid and Penicillin g  Review of Systems  Constitutional: Negative for chills, fever and malaise/fatigue.  HENT: Negative for congestion, sinus pain and sore throat.   Eyes: Negative for blurred vision and pain.  Respiratory: Negative for cough and wheezing.   Cardiovascular: Negative for chest pain and leg swelling.  Gastrointestinal: Negative for abdominal pain, constipation, diarrhea, heartburn, nausea and vomiting.  Genitourinary: Negative for dysuria, frequency, hematuria and urgency.  Musculoskeletal: Negative for back pain, joint pain, myalgias and neck pain.  Skin: Negative for itching and rash.  Neurological: Negative for dizziness, tremors and weakness.  Endo/Heme/Allergies: Does not bruise/bleed easily.  Psychiatric/Behavioral: Negative for depression. The patient is not nervous/anxious and does not have insomnia.     Objective: BP 140/80   Ht 5\' 5"  (1.651 m)   Wt 199 lb (90.3 kg)   BMI 33.12 kg/m   Filed Weights   08/26/19 1534  Weight: 199 lb (90.3 kg)   Body mass index is 33.12 kg/m. Physical Exam Constitutional:      General: She is not in acute distress.    Appearance: She is well-developed.  Genitourinary:     Pelvic exam was performed with patient supine.  Vagina, uterus and rectum normal.     No lesions in the vagina.     No vaginal bleeding.     No cervical motion tenderness, friability, lesion or polyp.     Uterus is mobile.     Uterus is not enlarged.     No uterine mass detected.    Uterus is midaxial.     No right or left adnexal mass present.     Right adnexa not tender.     Left adnexa not tender.  HENT:     Head: Normocephalic and atraumatic. No laceration.     Right Ear: Hearing normal.     Left Ear: Hearing normal.     Mouth/Throat:     Pharynx: Uvula midline.  Eyes:     Pupils: Pupils are equal, round, and  reactive to light.  Neck:     Thyroid: Thyromegaly present. No thyroid tenderness.  Cardiovascular:     Rate and Rhythm: Normal rate and regular rhythm.     Heart sounds: No murmur. No friction rub. No gallop.   Pulmonary:     Effort: Pulmonary effort is normal. No respiratory distress.     Breath sounds: Normal breath sounds. No wheezing.  Chest:     Breasts:        Right: No mass, skin change or tenderness.        Left: No mass, skin change or tenderness.  Abdominal:     General: Bowel sounds are normal. There is no distension.     Palpations: Abdomen is soft.     Tenderness: There is no abdominal tenderness. There is no rebound.  Musculoskeletal:        General: Normal range of motion.     Cervical back: Normal range of motion and neck supple.  Neurological:     Mental Status: She is alert and oriented to person, place, and time.     Cranial Nerves: No cranial nerve deficit.  Skin:    General: Skin is warm and dry.  Psychiatric:        Judgment: Judgment normal.  Vitals reviewed.     Assessment:  ANNUAL EXAM 1. Women's annual routine gynecological examination   2. Cervical cancer screening   3. Nexplanon insertion      Screening Plan:            1.  Cervical Screening-  Pap smear done today  2. Breast screening- Exam annually and mammogram>40 planned   3. Colonoscopy every 10 years, Hemoccult testing - after age 38  4. Labs managed by PCP  TSH today due to palpable thyroid prominence today  5. Counseling for contraception: Nexplanon planned after discussion of all options, see below     F/U  Return in about 1 year (around 08/25/2020) for Annual.    Nexplanon Insertion  Patient given informed consent, signed copy in the chart, time out was performed.  Appropriate time out taken.  Patient's LEFT arm was prepped and draped in the usual sterile fashion.. The ruler used to measure and mark insertion area.  Pt was prepped with betadine swab and then injected  with 3 cc of 2% lidocaine with epinephrine. Nexplanon removed form packaging,  Device confirmed in needle, then inserted full length of needle and withdrawn per handbook instructions.  Pt insertion site covered with steri-strip and a bandage.   Minimal blood loss.  Pt tolerated the procedure well.   Barnett Applebaum, MD, [facility], [facility] 08/26/2019  4:15 PM

## 2019-08-27 LAB — TSH: TSH: 0.006 u[IU]/mL — ABNORMAL LOW (ref 0.450–4.500)

## 2019-08-29 ENCOUNTER — Other Ambulatory Visit: Payer: Self-pay | Admitting: Obstetrics & Gynecology

## 2019-08-29 DIAGNOSIS — E010 Iodine-deficiency related diffuse (endemic) goiter: Secondary | ICD-10-CM

## 2019-08-29 NOTE — Progress Notes (Signed)
Referral Endocrine for hyperthyroid and thyromegaly on exam

## 2019-08-29 NOTE — Telephone Encounter (Signed)
Patient returned phone call. °

## 2019-08-30 DIAGNOSIS — Z5181 Encounter for therapeutic drug level monitoring: Secondary | ICD-10-CM | POA: Diagnosis not present

## 2019-08-31 LAB — CYTOLOGY - PAP: Diagnosis: NEGATIVE

## 2019-09-06 DIAGNOSIS — Z5181 Encounter for therapeutic drug level monitoring: Secondary | ICD-10-CM | POA: Diagnosis not present

## 2019-09-13 DIAGNOSIS — Z5181 Encounter for therapeutic drug level monitoring: Secondary | ICD-10-CM | POA: Diagnosis not present

## 2019-09-20 DIAGNOSIS — Z5181 Encounter for therapeutic drug level monitoring: Secondary | ICD-10-CM | POA: Diagnosis not present

## 2019-09-28 DIAGNOSIS — Z5181 Encounter for therapeutic drug level monitoring: Secondary | ICD-10-CM | POA: Diagnosis not present

## 2019-10-12 ENCOUNTER — Emergency Department (HOSPITAL_COMMUNITY)
Admission: EM | Admit: 2019-10-12 | Discharge: 2019-10-12 | Disposition: A | Discharge status: Home / Self Care | Payer: Medicaid Other | Attending: Emergency Medicine | Admitting: Emergency Medicine

## 2019-10-12 ENCOUNTER — Other Ambulatory Visit: Payer: Self-pay

## 2019-10-12 DIAGNOSIS — S299XXA Unspecified injury of thorax, initial encounter: Secondary | ICD-10-CM | POA: Diagnosis present

## 2019-10-12 DIAGNOSIS — Y92481 Parking lot as the place of occurrence of the external cause: Secondary | ICD-10-CM | POA: Diagnosis not present

## 2019-10-12 DIAGNOSIS — Z87891 Personal history of nicotine dependence: Secondary | ICD-10-CM | POA: Insufficient documentation

## 2019-10-12 DIAGNOSIS — S29012A Strain of muscle and tendon of back wall of thorax, initial encounter: Secondary | ICD-10-CM | POA: Diagnosis not present

## 2019-10-12 DIAGNOSIS — Y999 Unspecified external cause status: Secondary | ICD-10-CM | POA: Insufficient documentation

## 2019-10-12 DIAGNOSIS — Y939 Activity, unspecified: Secondary | ICD-10-CM | POA: Diagnosis not present
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
## 2019-10-12 NOTE — Discharge Instructions (Signed)

## 2019-10-12 NOTE — ED Provider Notes (Signed)
   Emergency Department Provider Note    Procedures     Jayln Branscom, Arlyss Repress, MD 10/17/19 1349
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
## 2019-10-18 DIAGNOSIS — Z5181 Encounter for therapeutic drug level monitoring: Secondary | ICD-10-CM | POA: Diagnosis not present

## 2019-10-19 ENCOUNTER — Other Ambulatory Visit: Payer: Self-pay

## 2019-10-21 ENCOUNTER — Encounter: Payer: Self-pay | Admitting: Internal Medicine

## 2019-10-21 ENCOUNTER — Ambulatory Visit (INDEPENDENT_AMBULATORY_CARE_PROVIDER_SITE_OTHER): Payer: Medicaid Other | Admitting: Internal Medicine

## 2019-10-21 ENCOUNTER — Other Ambulatory Visit: Payer: Self-pay

## 2019-10-21 VITALS — BP 118/72 | HR 67 | Temp 98.3°F | Ht 65.0 in | Wt 201.2 lb

## 2019-10-21 DIAGNOSIS — E069 Thyroiditis, unspecified: Secondary | ICD-10-CM

## 2019-10-21 DIAGNOSIS — R635 Abnormal weight gain: Secondary | ICD-10-CM

## 2019-10-21 LAB — BASIC METABOLIC PANEL
BUN: 19 mg/dL (ref 6–23)
CO2: 25 mEq/L (ref 19–32)
Calcium: 10 mg/dL (ref 8.4–10.5)
Chloride: 104 mEq/L (ref 96–112)
Creatinine, Ser: 0.66 mg/dL (ref 0.40–1.20)
GFR: 107.2 mL/min (ref >60.00)
Glucose, Bld: 81 mg/dL (ref 70–99)
Potassium: 4 mEq/L (ref 3.5–5.1)
Sodium: 137 mEq/L (ref 135–145)

## 2019-10-21 LAB — T4, FREE: Free T4: 0.39 ng/dL — ABNORMAL LOW (ref 0.60–1.60)

## 2019-10-21 LAB — TSH: TSH: 11.26 u[IU]/mL — ABNORMAL HIGH (ref 0.35–4.50)

## 2019-10-21 NOTE — Progress Notes (Signed)
Name: Nancy Grimes  MRN/ DOB: [id number], [birth year]    Age/ Sex: 29 y.o., female    PCP: Magdalene River, PA-C   Reason for Endocrinology Evaluation: Hyperthyroidism     Date of Initial Endocrinology Evaluation: 10/21/2019     HPI: [name] is a 29 y.o. female with unremarkable  past medical history . The patient presented for initial endocrinology clinic visit on 10/21/2019 for consultative assistance with her subclinical hyperthyroidism.   She is S/P C-section 05/01/2019   She was noted to have thyromegaly during a physical exam in 05/2019 with suppressed TSH at < 0.006 uUI/mL.   She has fatigue at the time, she was also having weight gain at the time and hair loss which she continues until now, denies constipation or depression.   Has chronic migraines  - worsening in nature. Denies vision changes except during the headache episode.  Has implantable contraception  She did notice local neck swelling back in October.   No FH of thyroid disease.    HISTORY:  Past Medical History:  Past Medical History:  Diagnosis Date  . Anemia   . Chronic headaches   . Miscarriage   . Rh negative state in antepartum period, third trimester    Past Surgical History:  Past Surgical History:  Procedure Laterality Date  . ARM SURGERY  2007   RIGHT ELBOW  . CESAREAN SECTION N/A 05/01/2019   Procedure: CESAREAN SECTION;  Surgeon: Catalina Antigua, MD;  Location: [facility] LD [facility]S;  Service: Obstetrics;  Laterality: N/A;  . VAGINAL DELIVERY     x3      Social History:  reports that she quit smoking about 7 years ago. She has a 0.13 pack-year smoking history. She has never used smokeless tobacco. She reports that she does not drink alcohol or use drugs.  Family History: family history includes ADD / ADHD in her father; Diabetes in her paternal grandmother and paternal uncle; Heart disease in her maternal grandfather and maternal grandmother; Hyperlipidemia in her father,  maternal grandfather, and maternal grandmother; Hypertension in her father; Stroke in her maternal grandfather; Thyroid disease in her cousin.   HOME MEDICATIONS: Allergies as of 10/21/2019      Reactions   Salicylic Acid Rash   Acne Medications   Penicillin G Swelling   Did it involve swelling of the face/tongue/throat, SOB, or low BP? Unknown Did it involve sudden or severe rash/hives, skin peeling, or any reaction on the inside of your mouth or nose? Unknown Did you need to seek medical attention at a hospital or doctor's office? Unknown When did it last happen?childhood If all above answers are "NO", may proceed with cephalosporin use.      Medication List       Accurate as of October 21, 2019  1:30 PM. If you have any questions, ask your nurse or doctor.        IRON PO Take by mouth. Every other day prn   multivitamin-prenatal 27-0.8 MG Tabs tablet Take 1 tablet by mouth daily at 12 noon.   polyethylene glycol powder 17 GM/SCOOP powder Commonly known as: GLYCOLAX/MIRALAX Take 255 g by mouth daily.         REVIEW OF SYSTEMS: A comprehensive ROS was conducted with the patient and is negative except as per HPI and below:  ROS     OBJECTIVE:  VS: BP 118/72 (BP Location: Left Arm, Patient Position: Sitting, Cuff Size: Large)   Pulse 67  Temp 98.3 F (36.8 C)   Ht 5\' 5"  (1.651 m)   Wt 201 lb 3.2 oz (91.3 kg)   SpO2 99%   BMI 33.48 kg/m    Wt Readings from Last 3 Encounters:  10/21/19 201 lb 3.2 oz (91.3 kg)  10/12/19 180 lb (81.6 kg)  08/26/19 199 lb (90.3 kg)     EXAM: General: Pt appears well and is in NAD  Eyes: External eye exam normal without stare, lid lag or exophthalmos.  EOM intact.  PERRL.  Neck: General: Supple without adenopathy. Thyroid: Thyroid size normal.  No goiter or nodules appreciated. No thyroid bruit.  Lungs: Clear with good BS bilat with no rales, rhonchi, or wheezes  Heart: Auscultation: RRR.  Abdomen: Normoactive bowel  sounds, soft, nontender, without masses or organomegaly palpable  Extremities:  BL LE: No pretibial edema normal ROM and strength.  Skin: Hair: Texture and amount normal with gender appropriate distribution Skin Inspection: No rashes Skin Palpation: Skin temperature, texture, and thickness normal to palpation  Neuro: Cranial nerves: II - XII grossly intact  Motor: Normal strength throughout DTRs: 2+ and symmetric in UE without delay in relaxation phase  Mental Status: Judgment, insight: Intact Orientation: Oriented to time, place, and person Mood and affect: No depression, anxiety, or agitation     DATA REVIEWED:   Results for Nancy Grimes, Nancy Grimes (MRN [id number]) as of 10/24/2019 11:59  Ref. Range 08/26/2019 16:50 10/21/2019 13:52  TSH Latest Ref Range: 0.35 - 4.50 uIU/mL 0.006 (L) 11.26 (H)  T4,Free(Direct) Latest Ref Range: 0.60 - 1.60 ng/dL  0.39 (L)       ASSESSMENT/PLAN/RECOMMENDATIONS:   1. Painless Thyroiditis:  -Patient with clinical presentation of painless thyroiditis.  Initial presentation of painless thyroiditis is usually hyperthyroidism, followed by hypothyroidism.  I did explain to the patient that some patients do recover with TFTs returned to normal and other patients they remain on long-term L T4 replacement. - Pt educated extensively on the correct way to take levothyroxine (first thing in the morning with water, 30 minutes before eating or taking other medications). - Pt encouraged to double dose the following day if she were to miss a dose given long half-life of levothyroxine.   Medications : Levothyroxine 50 mcg daily       2. Cushingoid features:   -Given her weight gain, excessive fatigue, pinkish color abdominal stria, even though the symptoms could be explained by hypothyroidism, but I will go ahead and screen her for Cushing's disease.  -We will proceed with 24-hour urine collection of free cortisol   Follow-up in 3 months  Addendum: Lab results  discussed with the patient on 10/24/2019.  Patient in agreement to start levothyroxine.    Signed electronically by: Mack Guise, MD  [facility] Endocrinology  [facility] [address] [place], [place] [address] Phone: [phone] FAX: [phone]   CC: Tenna Delaine D, PA-C [place] [address] Phone: [phone] Fax: [phone]   Return to Endocrinology clinic as below: No future appointments.

## 2019-10-21 NOTE — Patient Instructions (Addendum)
24-Hour Urine Collection   You will be collecting your urine for a 24-hour period of time.  Your timer starts with your first urine of the morning (For example - If you first pee at 9AM, your timer will start at 9AM)  Throw away your first urine of the morning  Collect your urine every time you pee for the next 24 hours STOP your urine collection 24 hours after you started the collection (For example - You would stop at 9AM the day after you started)    - The reason for the urine test is to make sure you are not making extra cortisol hormone and this is why you  Are having the tiredness, weight gain and headaches

## 2019-10-24 ENCOUNTER — Encounter: Payer: Self-pay | Admitting: Internal Medicine

## 2019-10-24 ENCOUNTER — Other Ambulatory Visit: Payer: Self-pay

## 2019-10-24 ENCOUNTER — Other Ambulatory Visit: Payer: Medicaid Other

## 2019-10-24 DIAGNOSIS — R635 Abnormal weight gain: Secondary | ICD-10-CM

## 2019-10-24 DIAGNOSIS — E069 Thyroiditis, unspecified: Secondary | ICD-10-CM | POA: Insufficient documentation

## 2019-10-24 MED ORDER — LEVOTHYROXINE SODIUM 50 MCG PO TABS: 50.0000 ug | ORAL_TABLET | Freq: Every day | ORAL | 1 refills | Status: DC

## 2019-10-25 DIAGNOSIS — Z5181 Encounter for therapeutic drug level monitoring: Secondary | ICD-10-CM | POA: Diagnosis not present

## 2019-10-27 LAB — CORTISOL, URINE, 24 HOUR
24 Hour urine volume (VMAHVA): 1250 mL
Cortisol (Ur), Free: 17.4 mcg/24 h (ref 4.0–50.0)
RESULTS RECEIVED: 1.3 g/(24.h) (ref 0.50–2.15)

## 2019-10-28 ENCOUNTER — Other Ambulatory Visit: Payer: Self-pay

## 2019-11-08 DIAGNOSIS — Z5181 Encounter for therapeutic drug level monitoring: Secondary | ICD-10-CM | POA: Diagnosis not present

## 2019-11-16 DIAGNOSIS — Z5181 Encounter for therapeutic drug level monitoring: Secondary | ICD-10-CM | POA: Diagnosis not present

## 2019-11-23 DIAGNOSIS — Z5181 Encounter for therapeutic drug level monitoring: Secondary | ICD-10-CM | POA: Diagnosis not present

## 2019-11-25 ENCOUNTER — Encounter: Payer: Self-pay | Admitting: Internal Medicine

## 2019-11-25 ENCOUNTER — Other Ambulatory Visit: Payer: Self-pay | Admitting: Internal Medicine

## 2019-11-25 ENCOUNTER — Telehealth: Payer: Self-pay | Admitting: Internal Medicine

## 2019-11-25 DIAGNOSIS — E039 Hypothyroidism, unspecified: Secondary | ICD-10-CM

## 2019-11-25 NOTE — Telephone Encounter (Signed)
Please advise 

## 2019-11-25 NOTE — Telephone Encounter (Signed)
Patient called stating had 6 panic attacks last night and she did not know if it was possibly a side affect to her synthroid? Ph# [phone]

## 2019-12-14 DIAGNOSIS — Z5181 Encounter for therapeutic drug level monitoring: Secondary | ICD-10-CM | POA: Diagnosis not present

## 2019-12-21 ENCOUNTER — Encounter: Payer: Self-pay | Admitting: Internal Medicine

## 2019-12-21 DIAGNOSIS — Z5181 Encounter for therapeutic drug level monitoring: Secondary | ICD-10-CM | POA: Diagnosis not present

## 2019-12-21 NOTE — Telephone Encounter (Signed)
Do you know the answer to this

## 2020-01-18 DIAGNOSIS — Z5181 Encounter for therapeutic drug level monitoring: Secondary | ICD-10-CM | POA: Diagnosis not present

## 2020-01-23 ENCOUNTER — Other Ambulatory Visit: Payer: Self-pay

## 2020-01-23 ENCOUNTER — Ambulatory Visit (INDEPENDENT_AMBULATORY_CARE_PROVIDER_SITE_OTHER): Payer: Medicaid Other | Admitting: Internal Medicine

## 2020-01-23 ENCOUNTER — Encounter: Payer: Self-pay | Admitting: Internal Medicine

## 2020-01-23 VITALS — BP 118/76 | HR 77 | Ht 65.0 in | Wt 204.8 lb

## 2020-01-23 DIAGNOSIS — E069 Thyroiditis, unspecified: Secondary | ICD-10-CM | POA: Diagnosis not present

## 2020-01-23 DIAGNOSIS — E039 Hypothyroidism, unspecified: Secondary | ICD-10-CM

## 2020-01-23 NOTE — Patient Instructions (Signed)

## 2020-01-23 NOTE — Progress Notes (Signed)
Name: Nancy Grimes  MRN/ DOB: [id number], [birth year]    Age/ Sex: 29 y.o., female     PCP: Dwain Sarna   Reason for Endocrinology Evaluation: Painless thyroiditis     Initial Endocrinology Clinic Visit: 10/21/2019    PATIENT IDENTIFIER: Nancy Grimes is a 29 y.o., female with unremarkable  past medical history She has followed with [facility] since 10/21/2019 for consultative assistance with management of her painless thyroiditis .   HISTORICAL SUMMARY: The patient was initially diagnosed with hyperthyroidism and thyromegaly 1 month after delivering a baby through C-section    Mother with thyroid disease  SUBJECTIVE:    Today (01/23/2020):  Nancy Grimes is here for follow up on painless thyroiditis. She has been on Levothyroxine  She is compliant with intake  Weight has been stable   Energy level stable No constipation or depression   Denies local neck symptons      HISTORY:  Past Medical History:  Past Medical History:  Diagnosis Date   Anemia    Chronic headaches    Miscarriage    Rh negative state in antepartum period, third trimester    Past Surgical History:  Past Surgical History:  Procedure Laterality Date   ARM SURGERY  2007   RIGHT ELBOW   CESAREAN SECTION N/A 05/01/2019   Procedure: CESAREAN SECTION;  Surgeon: Catalina Antigua, MD;  Location: [facility];  Service: Obstetrics;  Laterality: N/A;   VAGINAL DELIVERY     x3    Social History:  reports that she quit smoking about 7 years ago. She has a 0.13 pack-year smoking history. She has never used smokeless tobacco. She reports that she does not drink alcohol and does not use drugs. Family History:  Family History  Problem Relation Age of Onset   Hypertension Father    Hyperlipidemia Father    ADD / ADHD Father    Diabetes Paternal Grandmother        type2   Thyroid disease Cousin    Hyperlipidemia Maternal Grandmother    Heart disease Maternal  Grandmother    Heart disease Maternal Grandfather    Hyperlipidemia Maternal Grandfather    Stroke Maternal Grandfather    Diabetes Paternal Uncle      HOME MEDICATIONS: Allergies as of 01/23/2020      Reactions   Salicylic Acid Rash   Acne Medications   Penicillin G Swelling   Did it involve swelling of the face/tongue/throat, SOB, or low BP? Unknown Did it involve sudden or severe rash/hives, skin peeling, or any reaction on the inside of your mouth or nose? Unknown Did you need to seek medical attention at a hospital or doctor's office? Unknown When did it last happen?childhood If all above answers are NO, may proceed with cephalosporin use.      Medication List       Accurate as of January 23, 2020  3:40 PM. If you have any questions, ask your nurse or doctor.        IRON PO Take by mouth. Every other day prn   levothyroxine 50 MCG tablet Commonly known as: SYNTHROID Take 1 tablet (50 mcg total) by mouth daily.   multivitamin-prenatal 27-0.8 MG Tabs tablet Take 1 tablet by mouth daily at 12 noon.   polyethylene glycol powder 17 GM/SCOOP powder Commonly known as: GLYCOLAX/MIRALAX Take 255 g by mouth daily.         OBJECTIVE:   PHYSICAL EXAM: VS: BP 118/76 (BP  Location: Left Arm, Patient Position: Sitting, Cuff Size: Normal)    Pulse 77    Ht 5\' 5"  (1.651 m)    Wt 204 lb 12.8 oz (92.9 kg)    SpO2 98%    BMI 34.08 kg/m    EXAM: General: Pt appears well and is in NAD  Neck: General: Supple without adenopathy. Thyroid: Thyroid size normal.  No goiter or nodules appreciated. No thyroid bruit.  Lungs: Clear with good BS bilat with no rales, rhonchi, or wheezes  Heart: Auscultation: RRR.  Abdomen: Normoactive bowel sounds, soft, nontender, without masses or organomegaly palpable  Extremities:  BL LE: No pretibial edema normal ROM and strength.  Mental Status: Judgment, insight: Intact Orientation: Oriented to time, place, and person Mood and affect:  No depression, anxiety, or agitation     DATA REVIEWED:  Results for Nancy Grimes, Nancy Grimes (MRN [id number]) as of 01/26/2020 07:37  Ref. Range 08/26/2019 16:50 10/21/2019 13:52  TSH Latest Ref Range: 0.35 - 4.50 uIU/mL 0.006 (L) 11.26 (H)  T4,Free(Direct) Latest Ref Range: 0.60 - 1.60 ng/dL  0.39 (L)     ASSESSMENT / PLAN / RECOMMENDATIONS:   1. Painless Thyroiditis :   - Pt is clinically hypothyroid  - No local neck symptoms - Pt educated extensively on the correct way to take levothyroxine (first thing in the morning with water, 30 minutes before eating or taking other medications). - Pt encouraged to double dose the following day if she were to miss a dose given long half-life of levothyroxine. - Labs today    Medications  Levothyroxine 50 mcg daily     F/U in 6 months    Signed electronically by: Mack Guise, MD  [facility]  [facility] [address] [place], [place] [address] Phone: [phone] FAX: [phone]      CC: Leonie Douglas, PA-C No address on file Phone: None  Fax: None   Return to Endocrinology clinic as below: Future Appointments  Date Time Provider [place]  07/26/2020 10:30 AM Darlys Buis, Melanie Crazier, MD [facility] None

## 2020-01-25 ENCOUNTER — Other Ambulatory Visit: Payer: Medicaid Other

## 2020-01-25 ENCOUNTER — Other Ambulatory Visit: Payer: Self-pay | Admitting: Internal Medicine

## 2020-01-25 ENCOUNTER — Ambulatory Visit: Payer: Medicaid Other | Admitting: Internal Medicine

## 2020-01-25 DIAGNOSIS — E039 Hypothyroidism, unspecified: Secondary | ICD-10-CM

## 2020-01-26 ENCOUNTER — Other Ambulatory Visit: Payer: Self-pay

## 2020-01-26 ENCOUNTER — Other Ambulatory Visit (INDEPENDENT_AMBULATORY_CARE_PROVIDER_SITE_OTHER): Payer: Medicaid Other

## 2020-01-26 DIAGNOSIS — E039 Hypothyroidism, unspecified: Secondary | ICD-10-CM

## 2020-01-26 LAB — T4, FREE: Free T4: 0.76 ng/dL (ref 0.60–1.60)

## 2020-01-26 LAB — TSH: TSH: 1.84 u[IU]/mL (ref 0.35–4.50)

## 2020-01-31 ENCOUNTER — Encounter: Payer: Self-pay | Admitting: Internal Medicine

## 2020-01-31 ENCOUNTER — Other Ambulatory Visit: Payer: Self-pay

## 2020-01-31 MED ORDER — LEVOTHYROXINE SODIUM 50 MCG PO TABS: 50.0000 ug | ORAL_TABLET | Freq: Every day | ORAL | 1 refills | Status: DC

## 2020-03-29 ENCOUNTER — Other Ambulatory Visit: Payer: Self-pay

## 2020-03-29 ENCOUNTER — Ambulatory Visit (INDEPENDENT_AMBULATORY_CARE_PROVIDER_SITE_OTHER): Payer: Medicaid Other

## 2020-03-29 ENCOUNTER — Encounter (HOSPITAL_COMMUNITY): Payer: Self-pay | Admitting: Physician Assistant

## 2020-03-29 ENCOUNTER — Ambulatory Visit: Payer: Self-pay

## 2020-03-29 ENCOUNTER — Ambulatory Visit (HOSPITAL_COMMUNITY)
Admission: EM | Admit: 2020-03-29 | Discharge: 2020-03-29 | Disposition: A | Discharge status: Home / Self Care | Payer: Medicaid Other | Attending: Physician Assistant | Admitting: Physician Assistant

## 2020-03-29 DIAGNOSIS — U071 COVID-19: Secondary | ICD-10-CM

## 2020-03-29 DIAGNOSIS — J1282 Pneumonia due to coronavirus disease 2019: Secondary | ICD-10-CM | POA: Diagnosis not present

## 2020-03-29 DIAGNOSIS — R918 Other nonspecific abnormal finding of lung field: Secondary | ICD-10-CM | POA: Diagnosis not present

## 2020-03-29 MED ORDER — ALBUTEROL SULFATE HFA 108 (90 BASE) MCG/ACT IN AERS: 2.0000 | INHALATION_SPRAY | Freq: Four times a day (QID) | RESPIRATORY_TRACT | 2 refills | Status: DC | PRN

## 2020-03-29 NOTE — ED Triage Notes (Signed)
Pt presents with chest pressure the past few days: Pt states she tested positive for covid on 03/14/2020

## 2020-03-29 NOTE — Telephone Encounter (Signed)
Patient called stating that she is COVID-19 positive and has had fever for 5 days Today 102.  She also has headache cough and her chest hurts She states that it is a pressure feeling that is worse with cough. She has been treating herself with OTC tylenol and Mucinex  She states that if she stand up for a while she gets sweaty.  She has body aches. Patient was given instruction for OTC  tylenol and ibuprofen alternating dose every 3 hours. Patient does not want to go to ER.  She will set up tele visit through [facility] website instead. Care advice was read to patient she verbalized understanding. Patient last seen by Benjiman Core PA.  Will route note to office for follow up. Reason for Disposition . Chest pain or pressure  Answer Assessment - Initial Assessment Questions 1. TEMPERATURE: "What is the most recent temperature?"  "How was it measured?"      102 oral 2. ONSET: "When did the fever start?"      5 days ago from COVID-19 3. SYMPTOMS: "Do you have any other symptoms besides the fever?"  (e.g., colds, headache, sore throat, earache, cough, rash, diarrhea, vomiting, abdominal pain)    Migraine, cough, chest is heavy Chills 4. CAUSE: If there are no symptoms, ask: "What do you think is causing the fever?"     COVID-19 5. CONTACTS: "Does anyone else in the family have an infection?"     Husband  6. TREATMENT: "What have you done so far to treat this fever?" (e.g., medications)    Tylenol and musinex 7. IMMUNOCOMPROMISE: "Do you have of the following: diabetes, HIV positive, splenectomy, cancer chemotherapy, chronic steroid treatment, transplant patient, etc."     Thyroid dx 8. PREGNANCY: "Is there any chance you are pregnant?" "When was your last menstrual period?"    No IUD 9. TRAVEL: "Have you traveled out of the country in the last month?" (e.g., travel history, exposures)    N/A  Answer Assessment - Initial Assessment Questions 1. COVID-19 DIAGNOSIS: "Who made your Coronavirus  (COVID-19) diagnosis?" "Was it confirmed by a positive lab test?" If not diagnosed by a HCP, ask "Are there lots of cases (community spread) where you live?" (See public health department website, if unsure)     Confirmed positive 2. COVID-19 EXPOSURE: "Was there any known exposure to COVID before the symptoms began?" CDC Definition of close contact: within 6 feet (2 meters) for a total of 15 minutes or more over a 24-hour period.      N/A is positive 3. ONSET: "When did the COVID-19 symptoms start?"      5 days ago 4. WORST SYMPTOM: "What is your worst symptom?" (e.g., cough, fever, shortness of breath, muscle aches)     Fever Sob 5. COUGH: "Do you have a cough?" If Yes, ask: "How bad is the cough?"      cough 6. FEVER: "Do you have a fever?" If Yes, ask: "What is your temperature, how was it measured, and when did it start?"    102 7. RESPIRATORY STATUS: "Describe your breathing?" (e.g., shortness of breath, wheezing, unable to speak)     Sob when up moving 8. BETTER-SAME-WORSE: "Are you getting better, staying the same or getting worse compared to yesterday?"  If getting worse, ask, "In what way?"    same 9. HIGH RISK DISEASE: "Do you have any chronic medical problems?" (e.g., asthma, heart or lung disease, weak immune system, obesity, etc.)   no 10. PREGNANCY: "  Is there any chance you are pregnant?" "When was your last menstrual period?"       IUD 11. OTHER SYMPTOMS: "Do you have any other symptoms?"  (e.g., chills, fatigue, headache, loss of smell or taste, muscle pain, sore throat; new loss of smell or taste especially support the diagnosis of COVID-19)       Chills headache muscle pain tired sore throat  Protocols used: CORONAVIRUS (COVID-19) DIAGNOSED OR SUSPECTED-A-AH, FEVER-A-AH

## 2020-03-29 NOTE — ED Provider Notes (Signed)
[facility]    CSN: [id number] Arrival date & time: 03/29/20  1615      History   Chief Complaint Chief Complaint  Patient presents with  . Covid Positive - Chet Pain    HPI Nancy Grimes is a 29 y.o. female.   Who tested positive for Covid on 08/04. She thought she was improving until 4 days ago when she developed a fever (Max 100.3) and ongoing cough that is non-productive.  She notes mild feelings of "pressure" on her chest that is worse when she takes a deep breath. No frank dyspnea.  No upper respiratory symptoms are noted. No prior respiratory etiologies are noted.      Past Medical History:  Diagnosis Date  . Anemia   . Chronic headaches   . Miscarriage   . Rh negative state in antepartum period, third trimester     Patient Active Problem List   Diagnosis Date Noted  . Painless (silent) thyroiditis 10/24/2019  . Weight gain 10/24/2019  . Allergic contact dermatitis due to metals 02/17/2019  . Missed menses 02/17/2019  . Common migraine 06/16/2018  . Obesity (BMI 30.0-34.9) 06/16/2018    Past Surgical History:  Procedure Laterality Date  . ARM SURGERY  2007   RIGHT ELBOW  . CESAREAN SECTION N/A 05/01/2019   Procedure: CESAREAN SECTION;  Surgeon: Catalina Antigua, MD;  Location: [facility];  Service: Obstetrics;  Laterality: N/A;  . VAGINAL DELIVERY     x3    OB History    Gravida  5   Para  4   Term  4   Preterm      AB  1   Living  4     SAB  1   TAB      Ectopic      Multiple  0   Live Births  4            Home Medications    Prior to Admission medications   Medication Sig Start Date End Date Taking? Authorizing Provider  Ferrous Sulfate (IRON PO) Take by mouth. Every other day prn Patient not taking: Reported on 01/23/2020    [provider]  levothyroxine (SYNTHROID) 50 MCG tablet Take 1 tablet (50 mcg total) by mouth daily. 01/31/20   Shamleffer, Konrad Dolores, MD  polyethylene glycol powder  (GLYCOLAX/MIRALAX) 17 GM/SCOOP powder Take 255 g by mouth daily. Patient not taking: Reported on 06/02/2019 05/03/19   Joselyn Arrow, MD  Prenatal Vit-Fe Fumarate-FA (MULTIVITAMIN-PRENATAL) 27-0.8 MG TABS tablet Take 1 tablet by mouth daily at 12 noon. Patient not taking: Reported on 01/23/2020    [provider]    Family History Family History  Problem Relation Age of Onset  . Hypertension Father   . Hyperlipidemia Father   . ADD / ADHD Father   . Diabetes Paternal Grandmother        type2  . Thyroid disease Cousin   . Hyperlipidemia Maternal Grandmother   . Heart disease Maternal Grandmother   . Heart disease Maternal Grandfather   . Hyperlipidemia Maternal Grandfather   . Stroke Maternal Grandfather   . Diabetes Paternal Uncle     Social History Social History   Tobacco Use  . Smoking status: Former Smoker    Packs/day: 0.25    Years: 0.50    Pack years: 0.12    Quit date: 2014    Years since quitting: 7.6  . Smokeless tobacco: Never Used  Vaping Use  .  Vaping Use: Never used  Substance Use Topics  . Alcohol use: No    Comment: occasionally-half a glass a week  . Drug use: No     Allergies   Salicylic acid and Penicillin g   Review of Systems Review of Systems  Constitutional: Positive for fatigue and fever.  HENT: Negative.   Respiratory: Positive for cough. Negative for shortness of breath.   Cardiovascular: Positive for chest pain.  Skin: Negative for rash.     Physical Exam Triage Vital Signs ED Triage Vitals [03/29/20 1806]  Enc Vitals Group     BP      Pulse      Resp      Temp      Temp src      SpO2      Weight      Height      Head Circumference      Peak Flow      Pain Score 7     Pain Loc      Pain Edu?      Excl. in GC?    No data found.  Updated Vital Signs There were no vitals taken for this visit.  Visual Acuity Right Eye Distance:   Left Eye Distance:   Bilateral Distance:    Right Eye Near:   Left Eye  Near:    Bilateral Near:     Physical Exam Vitals and nursing note reviewed.  Constitutional:      General: She is not in acute distress.    Appearance: Normal appearance. She is not ill-appearing, toxic-appearing or diaphoretic.  HENT:     Head: Normocephalic.  Cardiovascular:     Rate and Rhythm: Normal rate and regular rhythm.     Pulses: Normal pulses.  Pulmonary:     Effort: Pulmonary effort is normal.     Breath sounds: Wheezing and rhonchi present.     Comments: Noted expiratory wheeze throughout bases with mild evidence of rhonci Musculoskeletal:     Cervical back: Normal range of motion.  Skin:    General: Skin is warm and dry.     Findings: No rash.  Neurological:     General: No focal deficit present.     Mental Status: She is alert.  Psychiatric:        Mood and Affect: Mood normal.      UC Treatments / Results  Labs (all labs ordered are listed, but only abnormal results are displayed) Labs Reviewed - No data to display  EKG   Radiology No results found.  Procedures Procedures (including critical care time)  Medications Ordered in UC Medications - No data to display  Initial Impression / Assessment and Plan / UC Course  I have reviewed the triage vital signs and the nursing notes.  Pertinent labs & imaging results that were available during my care of the patient were reviewed by me and considered in my medical decision making (see chart for details).     Discussed with Dr. Tracie Harrier. Symptomatic care for covid pneumonia. If worsening symptoms such as dyspnea then proceed to the ED.  Final Clinical Impressions(s) / UC Diagnoses   Final diagnoses:  None   Discharge Instructions   None    ED Prescriptions    None     PDMP not reviewed this encounter.   Riki Sheer, [place] 03/29/20 1911

## 2020-03-29 NOTE — Discharge Instructions (Addendum)
You have covid pneumonia. Treat with symptomatic care at this time. Delsym for cough, inhaler every 6 hours while awake for the next 48 hours, and rest and fluids. Please go to the ED if you have worsening shortness of breath. Feel better.

## 2020-03-30 NOTE — Telephone Encounter (Signed)
Noted  

## 2020-07-26 ENCOUNTER — Ambulatory Visit: Payer: Medicaid Other | Admitting: Internal Medicine

## 2020-07-26 NOTE — Progress Notes (Deleted)
Name: Nancy Grimes  MRN/ DOB: [id number], 01-30-1991    Age/ Sex: 29 y.o., female     PCP: Patient, No Pcp Per   Reason for Endocrinology Evaluation: Painless thyroiditis     Initial Endocrinology Clinic Visit: 10/21/2019    PATIENT IDENTIFIER: Nancy Grimes is a 29 y.o., female with unremarkable  past medical history She has followed with [facility] since 10/21/2019 for consultative assistance with management of her painless thyroiditis .   HISTORICAL SUMMARY: The patient was initially diagnosed with hyperthyroidism and thyromegaly 1 month after delivering a baby through C-section , she was subsequently diagnosed with painless thyroiditis requiring LT-4 replacement in 10/2019   Mother with thyroid disease  SUBJECTIVE:    Today (07/26/2020):  Nancy Grimes is here for follow up on painless thyroiditis. She has been on Levothyroxine  She is compliant with intake  Weight has been stable   Energy level stable No constipation or depression   Denies local neck symptons      HISTORY:  Past Medical History:  Past Medical History:  Diagnosis Date  . Anemia   . Chronic headaches   . Miscarriage   . Rh negative state in antepartum period, third trimester    Past Surgical History:  Past Surgical History:  Procedure Laterality Date  . ARM SURGERY  2007   RIGHT ELBOW  . CESAREAN SECTION N/A 05/01/2019   Procedure: CESAREAN SECTION;  Surgeon: Catalina Antigua, MD;  Location: [facility];  Service: Obstetrics;  Laterality: N/A;  . VAGINAL DELIVERY     x3    Social History:  reports that she quit smoking about 7 years ago. She has a 0.13 pack-year smoking history. She has never used smokeless tobacco. She reports that she does not drink alcohol and does not use drugs. Family History:  Family History  Problem Relation Age of Onset  . Hypertension Father   . Hyperlipidemia Father   . ADD / ADHD Father   . Diabetes Paternal Grandmother        type2  .  Thyroid disease Cousin   . Hyperlipidemia Maternal Grandmother   . Heart disease Maternal Grandmother   . Heart disease Maternal Grandfather   . Hyperlipidemia Maternal Grandfather   . Stroke Maternal Grandfather   . Diabetes Paternal Uncle      HOME MEDICATIONS: Allergies as of 07/26/2020      Reactions   Salicylic Acid Rash   Acne Medications   Penicillin G Swelling   Did it involve swelling of the face/tongue/throat, SOB, or low BP? Unknown Did it involve sudden or severe rash/hives, skin peeling, or any reaction on the inside of your mouth or nose? Unknown Did you need to seek medical attention at a hospital or doctor's office? Unknown When did it last happen?childhood If all above answers are "NO", may proceed with cephalosporin use.      Medication List       Accurate as of July 26, 2020  7:10 AM. If you have any questions, ask your nurse or doctor.        albuterol 108 (90 Base) MCG/ACT inhaler Commonly known as: VENTOLIN HFA Inhale 2 puffs into the lungs every 6 (six) hours as needed for wheezing or shortness of breath.   IRON PO Take by mouth. Every other day prn   levothyroxine 50 MCG tablet Commonly known as: SYNTHROID Take 1 tablet (50 mcg total) by mouth daily.   multivitamin-prenatal 27-0.8 MG Tabs tablet Take  1 tablet by mouth daily at 12 noon.   polyethylene glycol powder 17 GM/SCOOP powder Commonly known as: GLYCOLAX/MIRALAX Take 255 g by mouth daily.         OBJECTIVE:   PHYSICAL EXAM: VS: There were no vitals taken for this visit.   EXAM: General: Pt appears well and is in NAD  Neck: General: Supple without adenopathy. Thyroid: Thyroid size normal.  No goiter or nodules appreciated. No thyroid bruit.  Lungs: Clear with good BS bilat with no rales, rhonchi, or wheezes  Heart: Auscultation: RRR.  Abdomen: Normoactive bowel sounds, soft, nontender, without masses or organomegaly palpable  Extremities:  BL LE: No pretibial  edema normal ROM and strength.  Mental Status: Judgment, insight: Intact Orientation: Oriented to time, place, and person Mood and affect: No depression, anxiety, or agitation     DATA REVIEWED:  Results for SARYE, KATH (MRN [id number]) as of 01/26/2020 07:37  Ref. Range 08/26/2019 16:50 10/21/2019 13:52  TSH Latest Ref Range: 0.35 - 4.50 uIU/mL 0.006 (L) 11.26 (H)  T4,Free(Direct) Latest Ref Range: 0.60 - 1.60 ng/dL  8.14 (L)     ASSESSMENT / PLAN / RECOMMENDATIONS:   1. Painless Thyroiditis :   - Pt is clinically hypothyroid  - No local neck symptoms - Pt educated extensively on the correct way to take levothyroxine (first thing in the morning with water, 30 minutes before eating or taking other medications). - Pt encouraged to double dose the following day if she were to miss a dose given long half-life of levothyroxine. - Labs today    Medications  Levothyroxine 50 mcg daily     F/U in 6 months    Signed electronically by: Lyndle Herrlich, MD  [facility]  [facility] [address] [place] [place], [place] [address] Phone: [phone] FAX: [phone]      CC: Patient, No Pcp Per No address on file Phone: None  Fax: None   Return to Endocrinology clinic as below: Future Appointments  Date Time Provider Department Center  07/26/2020 10:30 AM Elsie Baynes, Konrad Dolores, MD [facility] None

## 2020-08-01 ENCOUNTER — Other Ambulatory Visit: Payer: Self-pay | Admitting: Internal Medicine

## 2020-08-29 ENCOUNTER — Ambulatory Visit: Payer: Medicaid Other | Admitting: Internal Medicine

## 2020-09-25 ENCOUNTER — Other Ambulatory Visit: Payer: Self-pay

## 2020-09-26 ENCOUNTER — Ambulatory Visit (INDEPENDENT_AMBULATORY_CARE_PROVIDER_SITE_OTHER): Payer: Medicaid Other | Admitting: Internal Medicine

## 2020-09-26 ENCOUNTER — Encounter: Payer: Self-pay | Admitting: Internal Medicine

## 2020-09-26 VITALS — BP 124/72 | HR 89 | Ht 65.0 in | Wt 225.5 lb

## 2020-09-26 DIAGNOSIS — E039 Hypothyroidism, unspecified: Secondary | ICD-10-CM

## 2020-09-26 LAB — TSH: TSH: 1.35 u[IU]/mL (ref 0.35–4.50)

## 2020-09-26 NOTE — Patient Instructions (Signed)

## 2020-09-26 NOTE — Progress Notes (Signed)
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 is a 30 y.o., female with unremarkable  past medical history She has followed with [facility] since 10/21/2019 for consultative assistance with management of her painless thyroiditis .   HISTORICAL SUMMARY: The patient was initially diagnosed with hyperthyroidism and thyromegaly 1 month after delivering a baby through C-section    Mother with thyroid disease     In 10/21/2019 she was screened for cushing syndrome but labs came back normal with a 24-hr urine cortisol of 17.4 mcg.    SUBJECTIVE:    Today (09/26/2020):  [name] is here for follow up on painless thyroiditis. She has been on Levothyroxine  She is compliant with intake  Has COVID PNA and still recovering , using inhaler as needed  She has been noted with weight gain  Continues with anxiety   Has intense migraines all over the head Denies local neck symptons  She is on Biotin  But has not taken in the past week     Mother and grandmother with migraine headaches    Has IUD Levothyroxine 50 mcg daily      HISTORY:  Past Medical History:  Past Medical History:  Diagnosis Date  . Anemia   . Chronic headaches   . Miscarriage   . Rh negative state in antepartum period, third trimester    Past Surgical History:  Past Surgical History:  Procedure Laterality Date  . ARM SURGERY  2007   RIGHT ELBOW  . CESAREAN SECTION N/A 05/01/2019   Procedure: CESAREAN SECTION;  Surgeon: Catalina Antigua, MD;  Location: [facility] LD [facility]S;  Service: Obstetrics;  Laterality: N/A;  . VAGINAL DELIVERY     x3    Social History:  reports that she quit smoking about 8 years ago. She has a 0.13 pack-year smoking history. She has never used  smokeless tobacco. She reports that she does not drink alcohol and does not use drugs. Family History:  Family History  Problem Relation Age of Onset  . Hypertension Father   . Hyperlipidemia Father   . ADD / ADHD Father   . Diabetes Paternal Grandmother        type2  . Thyroid disease Cousin   . Hyperlipidemia Maternal Grandmother   . Heart disease Maternal Grandmother   . Heart disease Maternal Grandfather   . Hyperlipidemia Maternal Grandfather   . Stroke Maternal Grandfather   . Diabetes Paternal Uncle      HOME MEDICATIONS: Allergies as of 09/26/2020      Reactions   Salicylic Acid Rash   Acne Medications   Penicillin G Swelling   Did it involve swelling of the face/tongue/throat, SOB, or low BP? Unknown Did it involve sudden or severe rash/hives, skin peeling, or any reaction on the inside of your mouth or nose? Unknown Did you need to seek medical attention at a hospital or doctor's office? Unknown When did it last happen?childhood If all above answers are "NO", may proceed with cephalosporin use.      Medication List       Accurate as of September 26, 2020 10:25 AM. If you have any questions, ask your nurse or doctor.        STOP taking these  medications   IRON PO Stopped by: Scarlette Shorts, MD   multivitamin-prenatal 27-0.8 MG Tabs tablet Stopped by: Scarlette Shorts, MD   polyethylene glycol powder 17 GM/SCOOP powder Commonly known as: GLYCOLAX/MIRALAX Stopped by: Scarlette Shorts, MD     TAKE these medications   albuterol 108 (90 Base) MCG/ACT inhaler Commonly known as: VENTOLIN HFA Inhale 2 puffs into the lungs every 6 (six) hours as needed for wheezing or shortness of breath.   levothyroxine 50 MCG tablet Commonly known as: SYNTHROID TAKE 1 TABLET(50 MCG) BY MOUTH DAILY         OBJECTIVE:   PHYSICAL EXAM: VS: BP 124/72   Pulse 89   Ht 5\' 5"  (1.651 m)   Wt 225 lb 8 oz (102.3 kg)   SpO2 98%   BMI 37.53 kg/m     EXAM: General: Pt appears well and is in NAD  Neck: General: Supple without adenopathy. Thyroid: Thyroid size normal.  No goiter or nodules appreciated. No thyroid bruit.  Lungs: Clear with good BS bilat with no rales, rhonchi, or wheezes  Heart: Auscultation: RRR.  Abdomen: Normoactive bowel sounds, soft, nontender, without masses or organomegaly palpable  Extremities:  BL LE: No pretibial edema normal ROM and strength.  Mental Status: Judgment, insight: Intact Orientation: Oriented to time, place, and person Mood and affect: No depression, anxiety, or agitation     DATA REVIEWED:  Results for ELLIANNE, GOWEN (MRN Anola Gurney) as of 09/27/2020 09:58  Ref. Range 09/26/2020 10:34  TSH Latest Ref Range: 0.35 - 4.50 uIU/mL 1.35   ASSESSMENT / PLAN / RECOMMENDATIONS:   1. Hypothyroidism :  - This was started as painless thyroiditis  with hyperthyroid phase followed by hypothyroidism, she is requiring long term LT-4 replacement.  - Pt is clinically hypothyroid  - No local neck symptoms - Labs today show normal TSH    Medications  Continue Levothyroxine 50 mcg daily     F/U in 6 months    Signed electronically by: 09/28/2020, MD  [facility] Endocrinology  [facility] [address] [place] [place], [place] Phone: [phone] FAX: [phone]      CC: Patient, No Pcp Per No address on file Phone: None  Fax: None   Return to Endocrinology clinic as below: No future appointments.

## 2020-09-27 MED ORDER — LEVOTHYROXINE SODIUM 50 MCG PO TABS: 50.0000 ug | ORAL_TABLET | Freq: Every day | ORAL | 3 refills | Status: DC

## 2020-10-22 ENCOUNTER — Other Ambulatory Visit: Payer: Self-pay

## 2020-10-22 ENCOUNTER — Emergency Department (HOSPITAL_COMMUNITY): Payer: Medicaid Other

## 2020-10-22 ENCOUNTER — Emergency Department (HOSPITAL_COMMUNITY)
Admission: EM | Admit: 2020-10-22 | Discharge: 2020-10-22 | Disposition: A | Discharge status: Home / Self Care | Payer: Medicaid Other | Attending: Emergency Medicine | Admitting: Emergency Medicine

## 2020-10-22 ENCOUNTER — Encounter (HOSPITAL_COMMUNITY): Payer: Self-pay

## 2020-10-22 DIAGNOSIS — R42 Dizziness and giddiness: Secondary | ICD-10-CM | POA: Diagnosis not present

## 2020-10-22 DIAGNOSIS — R519 Headache, unspecified: Secondary | ICD-10-CM | POA: Insufficient documentation

## 2020-10-22 DIAGNOSIS — Z79899 Other long term (current) drug therapy: Secondary | ICD-10-CM | POA: Diagnosis not present

## 2020-10-22 DIAGNOSIS — Z87891 Personal history of nicotine dependence: Secondary | ICD-10-CM | POA: Insufficient documentation

## 2020-10-22 DIAGNOSIS — E039 Hypothyroidism, unspecified: Secondary | ICD-10-CM | POA: Diagnosis not present

## 2020-10-22 DIAGNOSIS — H53149 Visual discomfort, unspecified: Secondary | ICD-10-CM | POA: Insufficient documentation

## 2020-10-22 DIAGNOSIS — R112 Nausea with vomiting, unspecified: Secondary | ICD-10-CM | POA: Insufficient documentation

## 2020-10-22 HISTORY — DX: Disorder of thyroid, unspecified: E07.9

## 2020-10-22 MED ORDER — DIPHENHYDRAMINE HCL 50 MG/ML IJ SOLN
25.0000 mg | Freq: Once | INTRAMUSCULAR | Status: DC
  Filled 2020-10-22: qty 1

## 2020-10-22 MED ORDER — SODIUM CHLORIDE 0.9 % IV BOLUS
500.0000 mL | Freq: Once | INTRAVENOUS | Status: AC
  Administered 2020-10-22: 500 mL via INTRAVENOUS

## 2020-10-22 MED ORDER — PROCHLORPERAZINE EDISYLATE 10 MG/2ML IJ SOLN
10.0000 mg | Freq: Once | INTRAMUSCULAR | Status: AC
  Administered 2020-10-22: 10 mg via INTRAVENOUS
  Filled 2020-10-22: qty 2

## 2020-10-22 NOTE — ED Triage Notes (Signed)
Patient reports a history of migraines. Patient states a migraine daily x 1 month, with N/V, light sensitivity and blurred vision. patient reports that today is the worse pain.

## 2020-10-22 NOTE — Discharge Instructions (Addendum)
Suspect you are suffering from a migraine.  I recommend over-the-counter pain medications like ibuprofen and or Tylenol every 6 hours as needed please follow dosing back of bottle.  You may also try Excedrin or Benadryl as this can also help with your headaches.  Would like you to follow-up with neurology for further management of your headaches.  Please call the number above to schedule an appointment.  Come back to the emergency department if you develop chest pain, shortness of breath, severe abdominal pain, uncontrolled nausea, vomiting, diarrhea.

## 2020-10-22 NOTE — ED Notes (Signed)
Patient transported to CT 

## 2020-10-22 NOTE — ED Notes (Signed)
Pt resting comfortably, brought pt water, husband at bedside, pt in NAD.

## 2020-10-22 NOTE — ED Provider Notes (Signed)
[facility]-EMERGENCY DEPT Provider Note   CSN: [id number] Arrival date & time: 10/22/20  1128     History Chief Complaint  Patient presents with  . Migraine    Nancy Grimes is a 30 y.o. female.  HPI   Patient with significant medical history of chronic headaches, hypothyroid disease presents with chief complaint of worsening headaches.  She endorses that she has been having headaches for the last month, they come on every day, describes them a sharp throbbing sensation with a pressure-like sensation behind her eyes, generally it remains in the front of her head, does not radiate.  It is associated with photophobia, increased sensitivity to noise, nausea and vomiting.  She denies recent head trauma, is not on anticoagulant, is fully up-to-date on her childhood vaccines.  She has been trying to take Tylenol without relief, denies alleviating factors.  She endorses that she has had migraines in the past but she is never had one like this before.  Patient denies , fevers, chills, shortness of breath, chest pain, abdominal pain, nausea, vomiting, diarrhea, worsening pedal edema.  Past Medical History:  Diagnosis Date  . Anemia   . Chronic headaches   . Miscarriage   . Rh negative state in antepartum period, third trimester   . Thyroid disease     Patient Active Problem List   Diagnosis Date Noted  . Painless (silent) thyroiditis 10/24/2019  . Weight gain 10/24/2019  . Allergic contact dermatitis due to metals 02/17/2019  . Missed menses 02/17/2019  . Common migraine 06/16/2018  . Obesity (BMI 30.0-34.9) 06/16/2018    Past Surgical History:  Procedure Laterality Date  . ARM SURGERY  2007   RIGHT ELBOW  . CESAREAN SECTION N/A 05/01/2019   Procedure: CESAREAN SECTION;  Surgeon: Catalina Antigua, MD;  Location: [facility] LD [facility]S;  Service: Obstetrics;  Laterality: N/A;  . VAGINAL DELIVERY     x3     OB History    Gravida  5   Para  4   Term  4   Preterm       AB  1   Living  4     SAB  1   IAB      Ectopic      Multiple  0   Live Births  4           Family History  Problem Relation Age of Onset  . Hypertension Father   . Hyperlipidemia Father   . ADD / ADHD Father   . Diabetes Paternal Grandmother        type2  . Thyroid disease Cousin   . Hyperlipidemia Maternal Grandmother   . Heart disease Maternal Grandmother   . Heart disease Maternal Grandfather   . Hyperlipidemia Maternal Grandfather   . Stroke Maternal Grandfather   . Diabetes Paternal Uncle     Social History   Tobacco Use  . Smoking status: Former Smoker    Packs/day: 0.25    Years: 0.50    Pack years: 0.12    Quit date: 2014    Years since quitting: 8.2  . Smokeless tobacco: Never Used  Vaping Use  . Vaping Use: Never used  Substance Use Topics  . Alcohol use: Not Currently  . Drug use: No    Home Medications Prior to Admission medications   Medication Sig Start Date End Date Taking? Authorizing Provider  albuterol (VENTOLIN HFA) 108 (90 Base) MCG/ACT inhaler Inhale 2 puffs into the lungs every  6 (six) hours as needed for wheezing or shortness of breath. 03/29/20   Riki Sheer, PA-C  levothyroxine (SYNTHROID) 50 MCG tablet Take 1 tablet (50 mcg total) by mouth daily before breakfast. 09/27/20   Shamleffer, Konrad Dolores, MD    Allergies    Salicylic acid and Penicillin g  Review of Systems   Review of Systems  Constitutional: Negative for chills and fever.  HENT: Negative for congestion.   Respiratory: Negative for shortness of breath.   Cardiovascular: Negative for chest pain.  Gastrointestinal: Positive for nausea and vomiting. Negative for abdominal pain and constipation.  Genitourinary: Negative for enuresis.  Musculoskeletal: Negative for back pain.  Skin: Negative for rash.  Neurological: Positive for headaches. Negative for dizziness.  Hematological: Does not bruise/bleed easily.    Physical Exam Updated Vital  Signs BP (!) 148/84   Pulse 78   Temp 98.1 F (36.7 C) (Oral)   Resp 17   Ht 5\' 4"  (1.626 m)   Wt 99.8 kg   SpO2 95%   BMI 37.76 kg/m   Physical Exam Vitals and nursing note reviewed.  Constitutional:      General: She is not in acute distress.    Appearance: She is not ill-appearing.  HENT:     Head: Normocephalic and atraumatic.     Nose: No congestion.     Mouth/Throat:     Mouth: Mucous membranes are moist.     Pharynx: Oropharynx is clear. No oropharyngeal exudate or posterior oropharyngeal erythema.  Eyes:     Extraocular Movements: Extraocular movements intact.     Conjunctiva/sclera: Conjunctivae normal.     Pupils: Pupils are equal, round, and reactive to light.  Cardiovascular:     Rate and Rhythm: Normal rate and regular rhythm.     Pulses: Normal pulses.     Heart sounds: No murmur heard. No friction rub. No gallop.   Pulmonary:     Effort: No respiratory distress.     Breath sounds: No wheezing, rhonchi or rales.  Abdominal:     Palpations: Abdomen is soft.     Tenderness: There is no abdominal tenderness.  Musculoskeletal:     Comments: Patient is moving all 4 extremities with difficulty.  Skin:    General: Skin is warm and dry.  Neurological:     Mental Status: She is alert.     GCS: GCS eye subscore is 4. GCS verbal subscore is 5. GCS motor subscore is 6.     Cranial Nerves: No cranial nerve deficit.     Sensory: Sensation is intact.     Motor: No weakness.     Coordination: Romberg sign negative. Finger-Nose-Finger Test normal.     Comments: Cranial nerves II through XII are grossly intact.  Patient having no difficulty with word finding.  Psychiatric:        Mood and Affect: Mood normal.     ED Results / Procedures / Treatments   Labs (all labs ordered are listed, but only abnormal results are displayed) Labs Reviewed - No data to display  EKG None  Radiology CT Head Wo Contrast  Result Date: 10/22/2020 CLINICAL DATA:  Headache,  chronic, new features or increased frequency. Additional history provided: Patient reports worsening migraines, dizziness, blurry vision for 1 month. EXAM: CT HEAD WITHOUT CONTRAST TECHNIQUE: Contiguous axial images were obtained from the base of the skull through the vertex without intravenous contrast. COMPARISON:  Head CT 04/10/2018. FINDINGS: Brain: Cerebral volume is normal. There is no  acute intracranial hemorrhage. No demarcated cortical infarct. No extra-axial fluid collection. No evidence of intracranial mass. No midline shift. The sella turcica. Vascular: No hyperdense vessel. Skull: Normal. Negative for fracture or focal lesion. Sinuses/Orbits: Visualized orbits show no acute finding. Trace mucosal thickening and frothy secretions within the bilateral ethmoid air cells. Trace mucosal thickening within the visualized maxillary sinuses bilaterally. IMPRESSION: No evidence of acute intracranial abnormality. Partially empty sella turcica. This finding is very commonly incidental, but can be associated with idiopathic intracranial hypertension. Mild bilateral ethmoid and maxillary sinusitis as described. Electronically Signed   By: Jackey Loge DO   On: 10/22/2020 13:30    Procedures Procedures   Medications Ordered in ED Medications  sodium chloride 0.9 % bolus 500 mL (0 mLs Intravenous Stopped 10/22/20 1405)  prochlorperazine (COMPAZINE) injection 10 mg (10 mg Intravenous Given 10/22/20 1301)    ED Course  I have reviewed the triage vital signs and the nursing notes.  Pertinent labs & imaging results that were available during my care of the patient were reviewed by me and considered in my medical decision making (see chart for details).    MDM Rules/Calculators/A&P                         Initial impression-patient presents with a headache.  She is alert, does not appear acute distress, vital signs notable for tachycardia.  Suspect patient suffering from a migraine versus intracranial mass  will obtain imaging for further evaluation.  Provide patient migraine cocktail and reassess.  Work-up-CT head is negative for acute findings.  Reassessment patient is reassessed after providing her with migraine cocktail and fluids, she states she feels much better, has no headaches at this time.  Vital signs have remained stable.  Patient is agreeable for discharge.  Rule out-I have low suspicion for intracranial head bleed or mass as there is no neuro deficits on my exam, head CT is negative for acute findings.  Low suspicion for systemic infection and/or meningitis as there is no meningeal signs on my exam, patient is nontoxic-appearing, vital signs reassuring no obvious source infection on my exam.  Low suspicion for carotic aneurysm or dissection as patient denies neck pain, no bulging mass on my exam, patient has low risk factors.  Plan-I suspect patient suffering from an acute on chronic exacerbation of migraines, will recommend over-the-counter pain medications, follow-up with neurology for further evaluation.  Vital signs have remained stable, no indication for hospital admission. Patient given at home care as well strict return precautions.  Patient verbalized that they understood agreed to said plan.   Final Clinical Impression(s) / ED Diagnoses Final diagnoses:  Bad headache    Rx / DC Orders ED Discharge Orders    None       Carroll Sage, PA-C 10/22/20 1412    Mancel Bale, MD 10/24/20 2221

## 2020-10-23 ENCOUNTER — Telehealth: Payer: Self-pay

## 2020-10-23 NOTE — Telephone Encounter (Signed)
Transition Care Management Follow-up Telephone Call  Date of discharge and from where: 10/22/2020 from [facility]  How have you been since you were released from the hospital? Pt stated that she is feeling much better today. Pt has no questions or concerns at this time.   Any questions or concerns? No  Items Reviewed:  Did the pt receive and understand the discharge instructions provided? Yes   Medications obtained and verified? Yes   Other? No   Any new allergies since your discharge? No   Dietary orders reviewed? n/a  Do you have support at home? Yes    Functional Questionnaire: (I = Independent and D = Dependent) ADLs: I  Bathing/Dressing- I  Meal Prep- I  Eating- I  Maintaining continence- I  Transferring/Ambulation- I  Managing Meds- I   Follow up appointments reviewed:   Special[facility] Hospital f/u appt confirmed? No  Pt has plans to call the neurologist today.   Are transportation arrangements needed? No   If their condition worsens, is the pt aware to call PCP or go to the Emergency Dept.? Yes  Was the patient provided with contact information for the PCP's office or ED? Yes  Was to pt encouraged to call back with questions or concerns? Yes

## 2020-12-17 ENCOUNTER — Ambulatory Visit (INDEPENDENT_AMBULATORY_CARE_PROVIDER_SITE_OTHER): Payer: Medicaid Other | Admitting: Obstetrics and Gynecology

## 2020-12-17 ENCOUNTER — Encounter: Payer: Self-pay | Admitting: Obstetrics and Gynecology

## 2020-12-17 ENCOUNTER — Other Ambulatory Visit: Payer: Self-pay

## 2020-12-17 VITALS — BP 130/84 | Ht 65.0 in | Wt 230.0 lb

## 2020-12-17 DIAGNOSIS — N898 Other specified noninflammatory disorders of vagina: Secondary | ICD-10-CM | POA: Diagnosis not present

## 2020-12-17 DIAGNOSIS — R399 Unspecified symptoms and signs involving the genitourinary system: Secondary | ICD-10-CM | POA: Diagnosis not present

## 2020-12-17 MED ORDER — FLUCONAZOLE 150 MG PO TABS: 150.0000 mg | ORAL_TABLET | Freq: Once | ORAL | 0 refills | Status: AC

## 2020-12-17 NOTE — Progress Notes (Signed)
Patient, No Pcp Per (Inactive)   Chief Complaint  Patient presents with  . Urinary Tract Infection    Urinary frequency and burning, abdominal right side pain and right side pelvic pain  . Vaginal Discharge    Fishy/sour odor, yellow discharge, itchiness and irritation since Friday    HPI:      [name] is a 30 y.o. [id number] whose LMP was No LMP recorded. Patient has had an implant., presents today for urinary frequency with good flow and dysuria the past 4 days, no hematuria. Had RT flank pain and pelvic pain that has improved. Taking AZO for UTI sx with some improvement. Hx of enterococcus faecalis on C&S 8/20. Had fever followed by nausea and diarrhea for 1 day a few days ago, sx resolved. No current fevers. Pt has also had increased yellow vag d/c with itching/irritation and odor for 4 days, no meds to treat. No prior abx use. No hx of BV. All sx in general improving. Pt going out of town and wants to make sure nothing to tx.  She is sex active, no new partners.    Past Medical History:  Diagnosis Date  . Anemia   . Chronic headaches   . Miscarriage   . Rh negative state in antepartum period, third trimester   . Thyroid disease     Past Surgical History:  Procedure Laterality Date  . ARM SURGERY  2007   RIGHT ELBOW  . CESAREAN SECTION N/A 05/01/2019   Procedure: CESAREAN SECTION;  Surgeon: Catalina Antigua, MD;  Location: [facility] LD ORS;  Service: Obstetrics;  Laterality: N/A;  . VAGINAL DELIVERY     x3    Family History  Problem Relation Age of Onset  . Hypertension Father   . Hyperlipidemia Father   . ADD / ADHD Father   . Diabetes Paternal Grandmother        type2  . Thyroid disease Cousin   . Hyperlipidemia Maternal Grandmother   . Heart disease Maternal Grandmother   . Heart disease Maternal Grandfather   . Hyperlipidemia Maternal Grandfather   . Stroke Maternal Grandfather   . Diabetes Paternal Uncle     Social History   Socioeconomic History   . Marital status: Married    Spouse name: Not on file  . Number of children: 4  . Years of education: 54  . Highest education level: Not on file  Occupational History  . Occupation: stay at home mom  . 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 Smoking status: Former Smoker    Packs/day: 0.25    Years: 0.50    Pack years: 0.12    Quit date: 2014    Years since quitting: 8.3  . Smokeless tobacco: Never Used  Vaping Use  . Vaping Use: Never used  Substance and Sexual Activity  . Alcohol use: Not Currently  . Drug use: No  . Sexual activity: Yes    Partners: Male    Birth control/protection: Implant  Other Topics Concern  . Not on file  Social History Narrative  . Not on file   Social Determinants of Health   Financial Resource Strain: Not on file  Food Insecurity: Not on file  Transportation Needs: Not on file  Physical Activity: Not on file  Stress: Not on file  Social Connections: Not on file  Intimate Partner Violence: Not on file    Outpatient Medications Prior to Visit  Medication Sig  Dispense Refill  . levothyroxine (SYNTHROID) 50 MCG tablet Take 1 tablet (50 mcg total) by mouth daily before breakfast. 90 tablet 3  . albuterol (VENTOLIN HFA) 108 (90 Base) MCG/ACT inhaler Inhale 2 puffs into the lungs every 6 (six) hours as needed for wheezing or shortness of breath. (Patient not taking: Reported on 12/17/2020) 8 g 2   No facility-administered medications prior to visit.      ROS:  Review of Systems  Constitutional: Negative for fever.  Gastrointestinal: Positive for diarrhea and nausea. Negative for blood in stool, constipation and vomiting.  Genitourinary: Positive for dysuria, frequency, pelvic pain and vaginal discharge. Negative for dyspareunia, flank pain, hematuria, urgency, vaginal bleeding and vaginal pain.  Musculoskeletal: Positive for back pain.  Skin: Negative for rash.    OBJECTIVE:   Vitals:  BP 130/84    Ht 5\' 5"  (1.651 m)   Wt 230 lb (104.3 kg)   Breastfeeding No   BMI 38.27 kg/m   Physical Exam Vitals reviewed.  Constitutional:      Appearance: She is well-developed. She is not ill-appearing or toxic-appearing.  Pulmonary:     Effort: Pulmonary effort is normal.  Abdominal:     Tenderness: There is no right CVA tenderness or left CVA tenderness.  Genitourinary:    General: Normal vulva.     Pubic Area: No rash.      Labia:        Right: No rash, tenderness or lesion.        Left: No rash, tenderness or lesion.      Vagina: Vaginal discharge present. No erythema or tenderness.     Cervix: Normal.     Uterus: Normal. Not enlarged and not tender.      Adnexa: Right adnexa normal and left adnexa normal.       Right: No mass or tenderness.         Left: No mass or tenderness.    Musculoskeletal:        General: Normal range of motion.     Cervical back: Normal range of motion.  Skin:    General: Skin is warm and dry.  Neurological:     General: No focal deficit present.     Mental Status: She is alert and oriented to person, place, and time.     Cranial Nerves: No cranial nerve deficit.  Psychiatric:        Mood and Affect: Mood normal.        Behavior: Behavior normal.        Thought Content: Thought content normal.        Judgment: Judgment normal.     Results: Results for orders placed or performed in visit on 12/17/20 (from the past 24 hour(s))  POCT Wet Prep with KOH     Status: Normal   Collection Time: 12/18/20  2:09 PM  Result Value Ref Range   Trichomonas, UA Negative    Clue Cells Wet Prep HPF POC neg    Epithelial Wet Prep HPF POC     Yeast Wet Prep HPF POC neg    Bacteria Wet Prep HPF POC     RBC Wet Prep HPF POC     WBC Wet Prep HPF POC     KOH Prep POC Negative Negative  POCT Urinalysis Dipstick     Status: Normal   Collection Time: 12/18/20  2:09 PM  Result Value Ref Range   Color, UA orange    Clarity, UA clear  Glucose, UA     Bilirubin,  UA     Ketones, UA     Spec Grav, UA 1.020 1.010 - 1.025   Blood, UA neg    pH, UA 6.0 5.0 - 8.0   Protein, UA     Urobilinogen, UA     Nitrite, UA     Leukocytes, UA Negative Negative   Appearance     Odor     PT TAKING AZO  Assessment/Plan: UTI symptoms - Plan: POCT Urinalysis Dipstick, Urine Culture, neg UA. Check C&S. Will f/u if pos. D/C AZO to see if sx resolve.  Vaginal discharge - Plan: POCT Wet Prep with KOH, fluconazole (DIFLUCAN) 150 MG tablet; neg wet prep/neg exam. Treat empirically with diflucan. F/u prn.   Sx consistent with viral gastroenteritis a few days ago. Sx resolved.   Meds ordered this encounter  Medications  . fluconazole (DIFLUCAN) 150 MG tablet    Sig: Take 1 tablet (150 mg total) by mouth once for 1 dose.    Dispense:  1 tablet    Refill:  0    Order Specific Question:   Supervising Provider    Answer:   Nadara Mustard [[id number]]      Return if symptoms worsen or fail to improve.  Uri Covey B. Taylinn Brabant, PA-C 12/18/2020 2:12 PM

## 2020-12-17 NOTE — Patient Instructions (Signed)
I value your feedback and you entrusting us with your care. If you get a [facility] patient survey, I would appreciate you taking the time to let us know about your experience today. Thank you! ? ? ?

## 2020-12-18 ENCOUNTER — Encounter: Payer: Self-pay | Admitting: Obstetrics and Gynecology

## 2020-12-18 LAB — POCT URINALYSIS DIPSTICK
Blood, UA: NEGATIVE
Leukocytes, UA: NEGATIVE
Spec Grav, UA: 1.02 (ref 1.010–1.025)
pH, UA: 6 (ref 5.0–8.0)

## 2020-12-18 LAB — POCT WET PREP WITH KOH
Clue Cells Wet Prep HPF POC: NEGATIVE
KOH Prep POC: NEGATIVE
Trichomonas, UA: NEGATIVE
Yeast Wet Prep HPF POC: NEGATIVE

## 2020-12-18 IMAGING — DX DG CHEST 2V
2 series · 2 of 2 positions shown · non-contrast
Comparison: 04/10/2018

CLINICAL DATA: [id number]

EXAM:
CHEST - 2 VIEW

[chest pa]
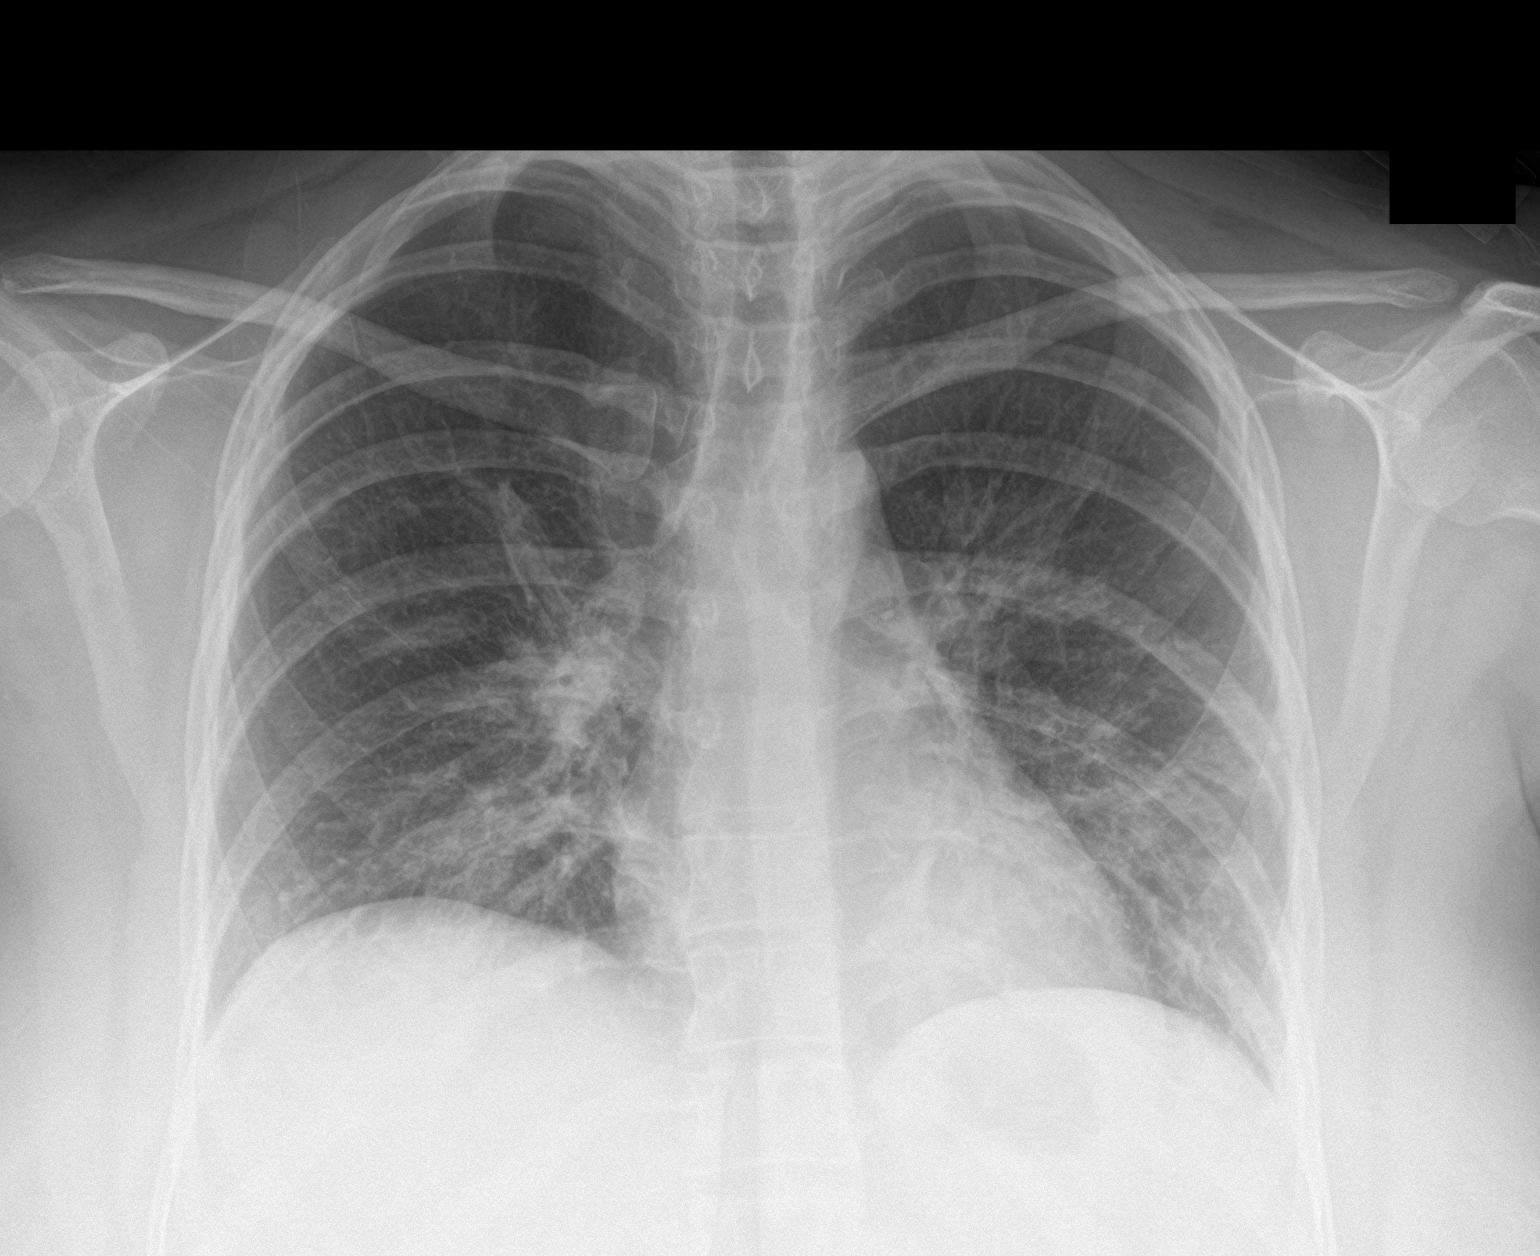

[chest lat]
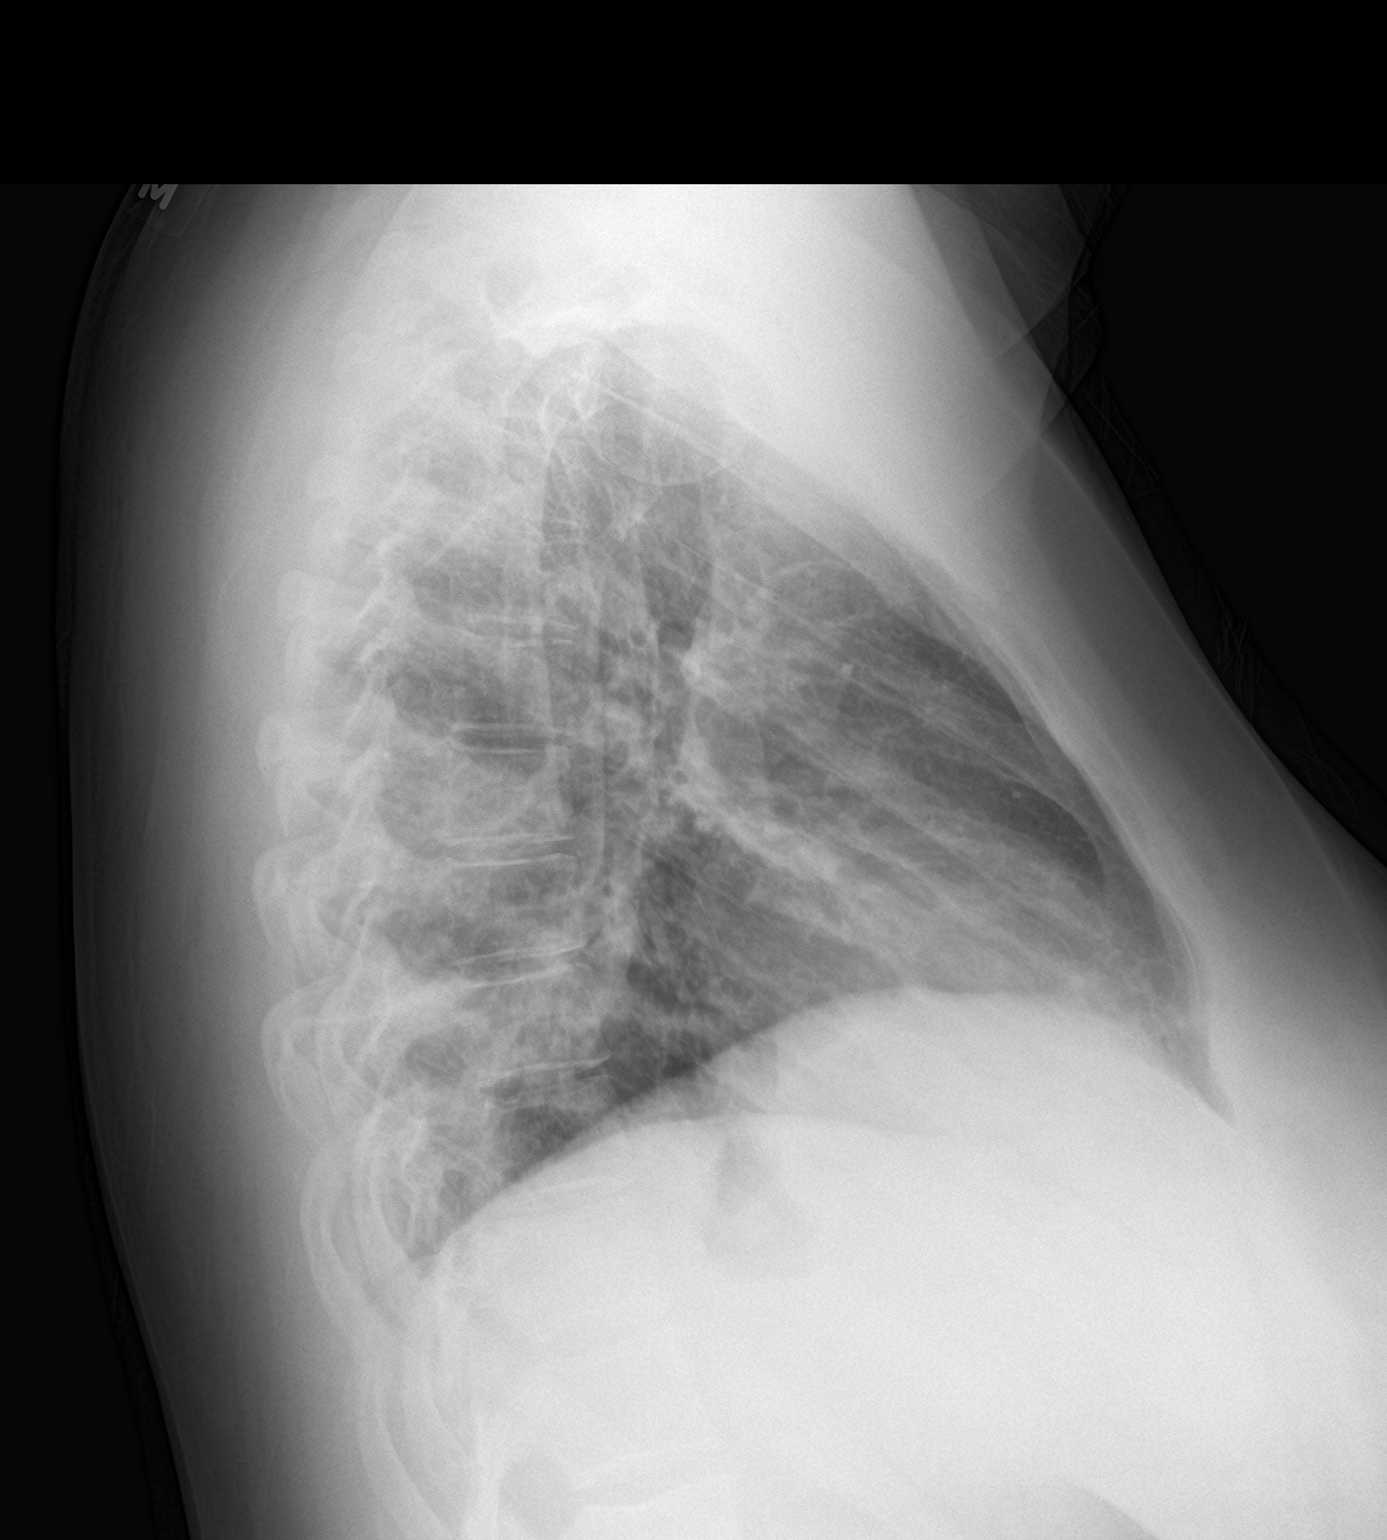

[2 of 2 positions shown; findings below may reference images not displayed]

FINDINGS: Bilateral basilar predominant opacities. Cardiomediastinal contours
are normal. No pleural effusion.
IMPRESSION: Bilateral basilar predominant opacities, likely indicating
multifocal infection.

## 2020-12-22 LAB — URINE CULTURE

## 2021-01-09 ENCOUNTER — Ambulatory Visit: Payer: Medicaid Other | Admitting: Neurology

## 2021-01-09 ENCOUNTER — Telehealth: Payer: Self-pay

## 2021-01-09 ENCOUNTER — Encounter: Payer: Self-pay | Admitting: Neurology

## 2021-01-09 NOTE — Telephone Encounter (Signed)
Pt no showed 01/09/21 appt with Dr. Frances Furbish.

## 2021-01-31 DIAGNOSIS — Z5181 Encounter for therapeutic drug level monitoring: Secondary | ICD-10-CM | POA: Diagnosis not present

## 2021-02-04 DIAGNOSIS — Z7689 Persons encountering health services in other specified circumstances: Secondary | ICD-10-CM | POA: Diagnosis not present

## 2021-02-04 DIAGNOSIS — Z1321 Encounter for screening for nutritional disorder: Secondary | ICD-10-CM | POA: Diagnosis not present

## 2021-02-04 DIAGNOSIS — E669 Obesity, unspecified: Secondary | ICD-10-CM | POA: Diagnosis not present

## 2021-02-04 DIAGNOSIS — E039 Hypothyroidism, unspecified: Secondary | ICD-10-CM | POA: Diagnosis not present

## 2021-03-27 ENCOUNTER — Ambulatory Visit: Payer: Medicaid Other | Admitting: Internal Medicine

## 2021-03-27 NOTE — Progress Notes (Deleted)
Name: Nancy Grimes  MRN/ DOB: [id number], November 02, 1990    Age/ Sex: 30 y.o., female     PCP: Patient, No Pcp Per (Inactive)   Reason for Endocrinology Evaluation: Painless thyroiditis     Initial Endocrinology Clinic Visit: 10/21/2019    PATIENT IDENTIFIER: Nancy Grimes is a 30 y.o., female with unremarkable  past medical history She has followed with [facility] since 10/21/2019 for consultative assistance with management of her painless thyroiditis .   HISTORICAL SUMMARY: The patient was initially diagnosed with hyperthyroidism and thyromegaly 1 month after delivering a baby through C-section    Mother with thyroid disease     In 10/21/2019 she was screened for cushing syndrome but labs came back normal with a 24-hr urine cortisol of 17.4 mcg.    SUBJECTIVE:    Today (03/27/2021):  Ms. Defreitas is here for follow up on painless thyroiditis. She has been on Levothyroxine  and is compliant with intake   She has been noted with weight gain  Continues with anxiety   Has intense migraines all over the head Denies local neck symptons  She is on Biotin  But has not taken in the past week     Mother and grandmother with migraine headaches    Has IUD Levothyroxine 50 mcg daily      HISTORY:  Past Medical History:  Past Medical History:  Diagnosis Date   Anemia    Chronic headaches    Miscarriage    Rh negative state in antepartum period, third trimester    Thyroid disease    Past Surgical History:  Past Surgical History:  Procedure Laterality Date   ARM SURGERY  2007   RIGHT ELBOW   CESAREAN SECTION N/A 05/01/2019   Procedure: CESAREAN SECTION;  Surgeon: Catalina Antigua, MD;  Location: [facility];  Service: Obstetrics;  Laterality: N/A;   VAGINAL DELIVERY     x3   Social History:  reports that she quit smoking about 8 years ago. She has a 0.13 pack-year smoking history. She has never used smokeless tobacco. She reports that she does not  currently use alcohol. She reports that she does not use drugs. Family History:  Family History  Problem Relation Age of Onset   Hypertension Father    Hyperlipidemia Father    ADD / ADHD Father    Diabetes Paternal Grandmother        type2   Thyroid disease Cousin    Hyperlipidemia Maternal Grandmother    Heart disease Maternal Grandmother    Heart disease Maternal Grandfather    Hyperlipidemia Maternal Grandfather    Stroke Maternal Grandfather    Diabetes Paternal Uncle      HOME MEDICATIONS: Allergies as of 03/27/2021       Reactions   Salicylic Acid Rash   Acne Medications   Penicillin G Swelling   Did it involve swelling of the face/tongue/throat, SOB, or low BP? Unknown Did it involve sudden or severe rash/hives, skin peeling, or any reaction on the inside of your mouth or nose? Unknown Did you need to seek medical attention at a hospital or doctor's office? Unknown When did it last happen?      childhood If all above answers are "NO", may proceed with cephalosporin use.        Medication List        Accurate as of March 27, 2021  7:20 AM. If you have any questions, ask your nurse or doctor.  albuterol 108 (90 Base) MCG/ACT inhaler Commonly known as: VENTOLIN HFA Inhale 2 puffs into the lungs every 6 (six) hours as needed for wheezing or shortness of breath.   levothyroxine 50 MCG tablet Commonly known as: SYNTHROID Take 1 tablet (50 mcg total) by mouth daily before breakfast.          OBJECTIVE:   PHYSICAL EXAM: VS: There were no vitals taken for this visit.   EXAM: General: Pt appears well and is in NAD  Neck: General: Supple without adenopathy. Thyroid: Thyroid size normal.  No goiter or nodules appreciated. No thyroid bruit.  Lungs: Clear with good BS bilat with no rales, rhonchi, or wheezes  Heart: Auscultation: RRR.  Abdomen: Normoactive bowel sounds, soft, nontender, without masses or organomegaly palpable  Extremities:  BL  LE: No pretibial edema normal ROM and strength.  Mental Status: Judgment, insight: Intact Orientation: Oriented to time, place, and person Mood and affect: No depression, anxiety, or agitation     DATA REVIEWED:  Results for BOBETTA, KORF (MRN [id number]) as of 09/27/2020 09:58  Ref. Range 09/26/2020 10:34  TSH Latest Ref Range: 0.35 - 4.50 uIU/mL 1.35   ASSESSMENT / PLAN / RECOMMENDATIONS:   Hypothyroidism :  - This was started as painless thyroiditis  with hyperthyroid phase followed by hypothyroidism, she is requiring long term LT-4 replacement.  - Pt is clinically hypothyroid  - No local neck symptoms - Labs today show normal TSH    Medications  Continue Levothyroxine 50 mcg daily     F/U in 6 months    Signed electronically by: Lyndle Herrlich, MD  [facility]  [facility] [address] [place] [place], [place] [address] Phone: [phone] FAX: [phone]      CC: Patient, No Pcp Per (Inactive) No address on file Phone: None  Fax: None   Return to Endocrinology clinic as below: Future Appointments  Date Time Provider Department Center  03/27/2021  9:50 AM Mozell Haber, Konrad Dolores, MD [facility] None

## 2021-04-01 DIAGNOSIS — Z20828 Contact with and (suspected) exposure to other viral communicable diseases: Secondary | ICD-10-CM | POA: Diagnosis not present

## 2021-04-01 DIAGNOSIS — R635 Abnormal weight gain: Secondary | ICD-10-CM | POA: Diagnosis not present

## 2021-04-01 DIAGNOSIS — U071 COVID-19: Secondary | ICD-10-CM | POA: Diagnosis not present

## 2021-04-01 DIAGNOSIS — E039 Hypothyroidism, unspecified: Secondary | ICD-10-CM | POA: Diagnosis not present

## 2021-05-01 DIAGNOSIS — Z13220 Encounter for screening for lipoid disorders: Secondary | ICD-10-CM | POA: Diagnosis not present

## 2021-05-01 DIAGNOSIS — E559 Vitamin D deficiency, unspecified: Secondary | ICD-10-CM | POA: Diagnosis not present

## 2021-05-01 DIAGNOSIS — E669 Obesity, unspecified: Secondary | ICD-10-CM | POA: Diagnosis not present

## 2021-05-01 DIAGNOSIS — E039 Hypothyroidism, unspecified: Secondary | ICD-10-CM | POA: Diagnosis not present

## 2021-05-01 DIAGNOSIS — Z6838 Body mass index (BMI) 38.0-38.9, adult: Secondary | ICD-10-CM | POA: Diagnosis not present

## 2021-05-16 DIAGNOSIS — E039 Hypothyroidism, unspecified: Secondary | ICD-10-CM | POA: Diagnosis not present

## 2021-05-16 DIAGNOSIS — E669 Obesity, unspecified: Secondary | ICD-10-CM | POA: Diagnosis not present

## 2021-07-17 DIAGNOSIS — R8290 Unspecified abnormal findings in urine: Secondary | ICD-10-CM | POA: Diagnosis not present

## 2021-07-17 DIAGNOSIS — E559 Vitamin D deficiency, unspecified: Secondary | ICD-10-CM | POA: Diagnosis not present

## 2021-07-17 DIAGNOSIS — Z0000 Encounter for general adult medical examination without abnormal findings: Secondary | ICD-10-CM | POA: Diagnosis not present

## 2021-07-17 DIAGNOSIS — Z0101 Encounter for examination of eyes and vision with abnormal findings: Secondary | ICD-10-CM | POA: Diagnosis not present

## 2021-07-17 DIAGNOSIS — Z6839 Body mass index (BMI) 39.0-39.9, adult: Secondary | ICD-10-CM | POA: Diagnosis not present

## 2021-10-25 DIAGNOSIS — R799 Abnormal finding of blood chemistry, unspecified: Secondary | ICD-10-CM | POA: Diagnosis not present

## 2021-10-25 DIAGNOSIS — E559 Vitamin D deficiency, unspecified: Secondary | ICD-10-CM | POA: Diagnosis not present

## 2022-01-27 DIAGNOSIS — E559 Vitamin D deficiency, unspecified: Secondary | ICD-10-CM | POA: Diagnosis not present

## 2022-07-23 DIAGNOSIS — R8290 Unspecified abnormal findings in urine: Secondary | ICD-10-CM | POA: Diagnosis not present

## 2022-07-23 DIAGNOSIS — Z6837 Body mass index (BMI) 37.0-37.9, adult: Secondary | ICD-10-CM | POA: Diagnosis not present

## 2022-07-23 DIAGNOSIS — E559 Vitamin D deficiency, unspecified: Secondary | ICD-10-CM | POA: Diagnosis not present

## 2022-07-23 DIAGNOSIS — Z1329 Encounter for screening for other suspected endocrine disorder: Secondary | ICD-10-CM | POA: Diagnosis not present

## 2022-07-23 DIAGNOSIS — Z1321 Encounter for screening for nutritional disorder: Secondary | ICD-10-CM | POA: Diagnosis not present

## 2022-07-23 DIAGNOSIS — Z0000 Encounter for general adult medical examination without abnormal findings: Secondary | ICD-10-CM | POA: Diagnosis not present

## 2022-07-23 DIAGNOSIS — Z6839 Body mass index (BMI) 39.0-39.9, adult: Secondary | ICD-10-CM | POA: Diagnosis not present

## 2022-07-23 DIAGNOSIS — Z0101 Encounter for examination of eyes and vision with abnormal findings: Secondary | ICD-10-CM | POA: Diagnosis not present

## 2022-07-23 DIAGNOSIS — Z13228 Encounter for screening for other metabolic disorders: Secondary | ICD-10-CM | POA: Diagnosis not present

## 2022-09-28 NOTE — Progress Notes (Deleted)
   No chief complaint on file.    HPI:  Nancy Grimes is a 32 y.o. [phone] here for Nexplanon removal and insertion. Neplanon placed 08/26/19  DUE FPOR NNAUL  There were no vitals taken for this visit.   Nexplanon removal Procedure note - The Nexplanon was noted in the patient's arm and the end was identified. The skin was cleansed with a Betadine solution. A small injection of subcutaneous lidocaine with epinephrine was given over the end of the implant. An incision was made at the end of the implant. The rod was noted in the incision and grasped with a hemostat. It was noted to be intact.  Steri-Strip was placed approximating the incision. Hemostasis was noted.  Nexplanon Insertion  Patient given informed consent, signed copy in the chart, time out was performed. Pregnancy test was ***. Appropriate time out taken.  Patient's LEFT/RIGHT *** arm was prepped and draped in the usual sterile fashion. The ruler used to measure and mark insertion area.  Pt was prepped with betadine swab and then injected with *** cc of 2% lidocaine with epinephrine. Nexplanon removed form packaging,  Device confirmed in needle, then inserted full length of needle and withdrawn per handbook instructions.  Pt insertion site covered with steri-strip and a bandage.   Minimal blood loss.  Pt tolerated the procedure well.  Assessment: No diagnosis found.   No orders of the defined types were placed in this encounter.    Plan:   She was told to remove the dressing in 12-24 hours, to keep the incision area dry for 24 hours and to remove the Steristrip in 2-3  days.  Notify us if any signs of tenderness, redness, pain, or fevers develop.   Novelle Addair B. Kaiyan Luczak, PA-C 09/28/2022 7:41 PM

## 2022-09-29 ENCOUNTER — Ambulatory Visit: Payer: Medicaid Other | Admitting: Obstetrics and Gynecology

## 2022-09-29 DIAGNOSIS — Z3046 Encounter for surveillance of implantable subdermal contraceptive: Secondary | ICD-10-CM

## 2022-10-29 NOTE — Progress Notes (Unsigned)
   No chief complaint on file.    HPI:  Nancy Grimes is a 32 y.o. [phone] here for Nexplanon removal and insertion.   There were no vitals taken for this visit.   Nexplanon removal Procedure note - The Nexplanon was noted in the patient's arm and the end was identified. The skin was cleansed with a Betadine solution. A small injection of subcutaneous lidocaine with epinephrine was given over the end of the implant. An incision was made at the end of the implant. The rod was noted in the incision and grasped with a hemostat. It was noted to be intact.  Steri-Strip was placed approximating the incision. Hemostasis was noted.  Nexplanon Insertion  Patient given informed consent, signed copy in the chart, time out was performed. Pregnancy test was ***. Appropriate time out taken.  Patient's LEFT/RIGHT *** arm was prepped and draped in the usual sterile fashion. The ruler used to measure and mark insertion area.  Pt was prepped with betadine swab and then injected with *** cc of 2% lidocaine with epinephrine. Nexplanon removed form packaging,  Device confirmed in needle, then inserted full length of needle and withdrawn per handbook instructions.  Pt insertion site covered with steri-strip and a bandage.   Minimal blood loss.  Pt tolerated the procedure well.  Assessment: No diagnosis found.   No orders of the defined types were placed in this encounter.    Plan:   She was told to remove the dressing in 12-24 hours, to keep the incision area dry for 24 hours and to remove the Steristrip in 2-3  days.  Notify us if any signs of tenderness, redness, pain, or fevers develop.   DUE FOR [place] B. Bethanie Bloxom, PA-C 10/29/2022 5:55 PM

## 2022-10-30 ENCOUNTER — Encounter: Payer: Self-pay | Admitting: Obstetrics and Gynecology

## 2022-10-30 ENCOUNTER — Ambulatory Visit: Payer: Medicaid Other | Admitting: Obstetrics and Gynecology

## 2022-10-30 VITALS — BP 124/90 | Ht 65.0 in | Wt 228.0 lb

## 2022-10-30 DIAGNOSIS — Z3202 Encounter for pregnancy test, result negative: Secondary | ICD-10-CM | POA: Diagnosis not present

## 2022-10-30 DIAGNOSIS — Z3046 Encounter for surveillance of implantable subdermal contraceptive: Secondary | ICD-10-CM | POA: Diagnosis not present

## 2022-10-30 LAB — POCT URINE PREGNANCY: Preg Test, Ur: NEGATIVE

## 2022-10-30 MED ORDER — ETONOGESTREL 68 MG ~~LOC~~ IMPL
68.0000 mg | DRUG_IMPLANT | Freq: Once | SUBCUTANEOUS | Status: AC
  Administered 2022-10-30: 68 mg via SUBCUTANEOUS

## 2022-10-30 NOTE — Patient Instructions (Signed)
I value your feedback and you entrusting us with your care. If you get a [facility] patient survey, I would appreciate you taking the time to let us know about your experience today. Thank you!  Remove the dressing in 24 hours,  keep the incision area dry for 24 hours and remove the Steristrip in 2-3  days.  Notify us if any signs of tenderness, redness, pain, or fevers develop.  

## 2022-10-31 ENCOUNTER — Encounter: Payer: Self-pay | Admitting: Obstetrics and Gynecology

## 2022-12-14 NOTE — Progress Notes (Deleted)
PCP:  Patient, No Pcp Per   No chief complaint on file.    HPI:      [name] is a 33 y.o. [id number] whose LMP was No LMP recorded. Patient has had an implant., presents today for her annual examination.  Her menses are {norm/abn:715}, lasting {number: 22536} days.  Dysmenorrhea {dysmen:716}. She {does:18564} have intermenstrual bleeding.  Sex activity: {sex active: 315163}. Nexplanon placled 08/26/19 Last Pap: 06/16/18 Results were: no abnormalities  Hx of STDs: {STD hx:14358}  There is no FH of breast cancer. There is no FH of ovarian cancer. The patient {does:18564} do self-breast exams.  Tobacco use: {tob:20664} Alcohol use: {Alcohol:11675} No drug use.  Exercise: {exercise:31265}  She {does:18564} get adequate calcium and Vitamin D in her diet.  Patient Active Problem List   Diagnosis Date Noted   Painless (silent) thyroiditis 10/24/2019   Weight gain 10/24/2019   Allergic contact dermatitis due to metals 02/17/2019   Missed menses 02/17/2019   Common migraine 06/16/2018   Obesity (BMI 30.0-34.9) 06/16/2018    Past Surgical History:  Procedure Laterality Date   ARM SURGERY  2007   RIGHT ELBOW   CESAREAN SECTION N/A 05/01/2019   Procedure: CESAREAN SECTION;  Surgeon: Catalina Antigua, MD;  Location: [facility];  Service: Obstetrics;  Laterality: N/A;   VAGINAL DELIVERY     x3    Family History  Problem Relation Age of Onset   Hypertension Father    Hyperlipidemia Father    ADD / ADHD Father    Diabetes Paternal Grandmother        type2   Thyroid disease Cousin    Hyperlipidemia Maternal Grandmother    Heart disease Maternal Grandmother    Heart disease Maternal Grandfather    Hyperlipidemia Maternal Grandfather    Stroke Maternal Grandfather    Diabetes Paternal Uncle     Social History   Socioeconomic History   Marital status: Married    Spouse name: Not on file   Number of children: 4   Years of education: 13   Highest education level:  Not on file  Occupational History   Occupation: stay at home mom   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  Tobacco Use   Smoking status: Former    Packs/day: 0.25    Years: 0.50    Additional pack years: 0.00    Total pack years: 0.13    Types: Cigarettes    Quit date: 2014    Years since quitting: 10.3   Smokeless tobacco: Never  Vaping Use   Vaping Use: Never used  Substance and Sexual Activity   Alcohol use: Not Currently   Drug use: No   Sexual activity: Yes    Partners: Male    Birth control/protection: Implant  Other Topics Concern   Not on file  Social History Narrative   Not on file   Social Determinants of Health   Financial Resource Strain: Not on file  Food Insecurity: Not on file  Transportation Needs: Not on file  Physical Activity: Not on file  Stress: Not on file  Social Connections: Not on file  Intimate Partner Violence: Not on file     Current Outpatient Medications:    albuterol (VENTOLIN HFA) 108 (90 Base) MCG/ACT inhaler, Inhale 2 puffs into the lungs every 6 (six) hours as needed for wheezing or shortness of breath., Disp: 8 g, Rfl: 2     ROS:  Review of Systems BREAST: No  symptoms   Objective: There were no vitals taken for this visit.   OBGyn Exam  Results: No results found for this or any previous visit (from the past 24 hour(s)).  Assessment/Plan: No diagnosis found.  No orders of the defined types were placed in this encounter.            GYN counsel {counseling: 16159}     F/U  No follow-ups on file.  Kathrynne Kulinski B. Deidre Carino, PA-C 12/14/2022 8:46 AM

## 2022-12-15 ENCOUNTER — Ambulatory Visit: Payer: Medicaid Other | Admitting: Obstetrics and Gynecology

## 2022-12-15 DIAGNOSIS — Z01419 Encounter for gynecological examination (general) (routine) without abnormal findings: Secondary | ICD-10-CM

## 2022-12-15 DIAGNOSIS — Z1151 Encounter for screening for human papillomavirus (HPV): Secondary | ICD-10-CM

## 2022-12-15 DIAGNOSIS — Z124 Encounter for screening for malignant neoplasm of cervix: Secondary | ICD-10-CM

## 2023-02-09 DIAGNOSIS — K921 Melena: Secondary | ICD-10-CM | POA: Diagnosis not present

## 2023-02-09 DIAGNOSIS — K625 Hemorrhage of anus and rectum: Secondary | ICD-10-CM | POA: Diagnosis not present

## 2023-02-09 DIAGNOSIS — Z20828 Contact with and (suspected) exposure to other viral communicable diseases: Secondary | ICD-10-CM | POA: Diagnosis not present

## 2023-02-09 DIAGNOSIS — R112 Nausea with vomiting, unspecified: Secondary | ICD-10-CM | POA: Diagnosis not present

## 2023-02-09 DIAGNOSIS — R197 Diarrhea, unspecified: Secondary | ICD-10-CM | POA: Diagnosis not present

## 2023-09-16 ENCOUNTER — Telehealth: Payer: Self-pay

## 2023-09-16 DIAGNOSIS — Z975 Presence of (intrauterine) contraceptive device: Secondary | ICD-10-CM

## 2023-09-16 NOTE — Telephone Encounter (Signed)
 The patient is scheduled for 2/6 with Nancy Grimes

## 2023-09-16 NOTE — Telephone Encounter (Signed)
 Patient states she has a nexplanon  (her second one) that was placed 10/2022. Her periods are usually regular and normal. However, she has been bleeding for 18 days straight. Patient states it's a little better now than it was, but overall has been heavy. She is currently changing pad q hr. Advised on heavy bleeding protocol and to take Ibuprofen  600 mg q6h or 800 mg q8 for pain and to help with bleeding. Patient advised can monitor and scheduled next available appointment or go to ED. Patient requested to scheduled next available appointment. Transferred to front desk for scheduling.

## 2023-09-17 ENCOUNTER — Ambulatory Visit: Payer: Medicaid Other | Admitting: Obstetrics and Gynecology

## 2023-09-17 MED ORDER — NORETHINDRONE 0.35 MG PO TABS: 1.0000 | ORAL_TABLET | Freq: Every day | ORAL | 0 refills | Status: DC

## 2023-09-17 NOTE — Telephone Encounter (Signed)
 Can you pls call pt and let her know she needs to schedule annual (sorry I didn't see this earlier). Thx.

## 2023-09-17 NOTE — Telephone Encounter (Signed)
 Pls f/u with pt. This is normal for nexplanon , even if new sx for her. We can add birth control pills for 3 months to stop the bleeding if she wants but there is nothing else that needs to be done. Doesn't need appt.

## 2023-09-17 NOTE — Addendum Note (Signed)
 Addended by: Alyn Judge B on: 09/17/2023 09:04 AM   Modules accepted: Orders

## 2023-09-17 NOTE — Telephone Encounter (Signed)
 Rx eRxd. Pt needs to RTO for annual.

## 2023-10-20 ENCOUNTER — Ambulatory Visit: Payer: Medicaid Other | Admitting: Obstetrics and Gynecology

## 2023-10-20 NOTE — Progress Notes (Deleted)
 PCP:  Patient, No Pcp Per   No chief complaint on file.    HPI:      [name] is a 33 y.o. [id number] whose LMP was No LMP recorded. Patient has had an implant., presents today for her annual examination.  Her menses are {norm/abn:715}, lasting {number: 22536} days.  Dysmenorrhea {dysmen:716}. She {does:18564} have intermenstrual bleeding.  Sex activity: {sex active: 315163}. Nexplanon Replaced 10/30/22 Last Pap: 08/26/19 Results were: no abnormalities Hx of STDs: {STD hx:14358}  There is no FH of breast cancer. There is no FH of ovarian cancer. The patient {does:18564} do self-breast exams.  Tobacco use: {tob:20664} Alcohol use: {Alcohol:11675} No drug use.  Exercise: {exercise:31265}  She {does:18564} get adequate calcium and Vitamin D in her diet.  Patient Active Problem List   Diagnosis Date Noted   Painless (silent) thyroiditis 10/24/2019   Weight gain 10/24/2019   Allergic contact dermatitis due to metals 02/17/2019   Missed menses 02/17/2019   Common migraine 06/16/2018   Obesity (BMI 30.0-34.9) 06/16/2018    Past Surgical History:  Procedure Laterality Date   ARM SURGERY  2007   RIGHT ELBOW   CESAREAN SECTION N/A 05/01/2019   Procedure: CESAREAN SECTION;  Surgeon: Catalina Antigua, MD;  Location: [facility];  Service: Obstetrics;  Laterality: N/A;   VAGINAL DELIVERY     x3    Family History  Problem Relation Age of Onset   Hypertension Father    Hyperlipidemia Father    ADD / ADHD Father    Diabetes Paternal Grandmother        type2   Thyroid disease Cousin    Hyperlipidemia Maternal Grandmother    Heart disease Maternal Grandmother    Heart disease Maternal Grandfather    Hyperlipidemia Maternal Grandfather    Stroke Maternal Grandfather    Diabetes Paternal Uncle     Social History   Socioeconomic History   Marital status: Married    Spouse name: Not on file   Number of children: 4   Years of education: 13   Highest education level:  Not on file  Occupational History   Occupation: stay at home mom   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  Tobacco Use   Smoking status: Former    Current packs/day: 0.00    Average packs/day: 0.3 packs/day for 0.5 years (0.1 ttl pk-yrs)    Types: Cigarettes    Start date: 02/2012    Quit date: 2014    Years since quitting: 11.1   Smokeless tobacco: Never  Vaping Use   Vaping status: Never Used  Substance and Sexual Activity   Alcohol use: Not Currently   Drug use: No   Sexual activity: Yes    Partners: Male    Birth control/protection: Implant  Other Topics Concern   Not on file  Social History Narrative   Not on file   Social Drivers of Health   Financial Resource Strain: Not on file  Food Insecurity: Not on file  Transportation Needs: Not on file  Physical Activity: Not on file  Stress: Not on file  Social Connections: Not on file  Intimate Partner Violence: Not on file     Current Outpatient Medications:    albuterol (VENTOLIN HFA) 108 (90 Base) MCG/ACT inhaler, Inhale 2 puffs into the lungs every 6 (six) hours as needed for wheezing or shortness of breath., Disp: 8 g, Rfl: 2   norethindrone (MICRONOR) 0.35 MG tablet, Take 1 tablet (0.35  mg total) by mouth daily., Disp: 84 tablet, Rfl: 0     ROS:  Review of Systems BREAST: No symptoms   Objective: There were no vitals taken for this visit.   OBGyn Exam  Results: No results found for this or any previous visit (from the past 24 hours).  Assessment/Plan: No diagnosis found.  No orders of the defined types were placed in this encounter.            GYN counsel {counseling: 16159}     F/U  No follow-ups on file.  Erman Thum B. Lenae Wherley, PA-C 10/20/2023 12:31 PM

## 2023-11-10 ENCOUNTER — Ambulatory Visit (INDEPENDENT_AMBULATORY_CARE_PROVIDER_SITE_OTHER): Admitting: Obstetrics and Gynecology

## 2023-11-10 ENCOUNTER — Encounter: Payer: Self-pay | Admitting: Obstetrics and Gynecology

## 2023-11-10 ENCOUNTER — Other Ambulatory Visit (HOSPITAL_COMMUNITY)
Admission: RE | Admit: 2023-11-10 | Discharge: 2023-11-10 | Disposition: A | Discharge status: Home / Self Care | Source: Ambulatory Visit | Attending: Obstetrics and Gynecology | Admitting: Obstetrics and Gynecology

## 2023-11-10 VITALS — BP 112/70 | Ht 65.0 in | Wt 229.0 lb

## 2023-11-10 DIAGNOSIS — Z01419 Encounter for gynecological examination (general) (routine) without abnormal findings: Secondary | ICD-10-CM

## 2023-11-10 DIAGNOSIS — Z124 Encounter for screening for malignant neoplasm of cervix: Secondary | ICD-10-CM | POA: Insufficient documentation

## 2023-11-10 DIAGNOSIS — Z1151 Encounter for screening for human papillomavirus (HPV): Secondary | ICD-10-CM | POA: Insufficient documentation

## 2023-11-10 DIAGNOSIS — Z3046 Encounter for surveillance of implantable subdermal contraceptive: Secondary | ICD-10-CM

## 2023-11-10 NOTE — Progress Notes (Signed)
 PCP:  Patient, No Pcp Per   Chief Complaint  Patient presents with   Gynecologic Exam    Since Nexplanon replacement in 2024, no cycles until about a month-ish ago. Lasted 30+ days, no abnormal pain.     HPI:      [name] is a 33 y.o. [id number] whose LMP was No LMP recorded. Patient has had an implant., presents today for her annual examination.  Her menses are infrequent with nexplanon. Had 42 days mod bleeding in Feb, bleeding stopped with camila POPs. No sx since. No dysmen. Was mostly amenorrheic with first nexplanon. Doing well now.   Sex activity: single partner, contraception -  Nexplanon Replaced 10/30/22. No pain/bleeding/dryness. Last Pap: 08/26/19 Results were: no abnormalities  There is no FH of breast cancer. There is no FH of ovarian cancer. The patient does do self-breast exams.  Tobacco use: The patient denies current or previous tobacco use. Alcohol use: none No drug use.  Exercise: moderately active  She does get adequate calcium and Vitamin D in her diet.  Patient Active Problem List   Diagnosis Date Noted   Painless (silent) thyroiditis 10/24/2019   Weight gain 10/24/2019   Allergic contact dermatitis due to metals 02/17/2019   Missed menses 02/17/2019   Common migraine 06/16/2018   Obesity (BMI 30.0-34.9) 06/16/2018    Past Surgical History:  Procedure Laterality Date   ARM SURGERY  2007   RIGHT ELBOW   CESAREAN SECTION N/A 05/01/2019   Procedure: CESAREAN SECTION;  Surgeon: Catalina Antigua, MD;  Location: [facility];  Service: Obstetrics;  Laterality: N/A;   VAGINAL DELIVERY     x3    Family History  Problem Relation Age of Onset   Hypertension Father    Hyperlipidemia Father    ADD / ADHD Father    Diabetes Paternal Grandmother        type2   Thyroid disease Cousin    Hyperlipidemia Maternal Grandmother    Heart disease Maternal Grandmother    Heart disease Maternal Grandfather    Hyperlipidemia Maternal Grandfather    Stroke  Maternal Grandfather    Diabetes Paternal Uncle     Social History   Socioeconomic History   Marital status: Married    Spouse name: Not on file   Number of children: 4   Years of education: 13   Highest education level: Not on file  Occupational History   Occupation: stay at home mom   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  Tobacco Use   Smoking status: Former    Current packs/day: 0.00    Average packs/day: 0.3 packs/day for 0.5 years (0.1 ttl pk-yrs)    Types: Cigarettes    Start date: 02/2012    Quit date: 2014    Years since quitting: 11.2   Smokeless tobacco: Never  Vaping Use   Vaping status: Never Used  Substance and Sexual Activity   Alcohol use: Not Currently   Drug use: No   Sexual activity: Yes    Partners: Male    Birth control/protection: Implant  Other Topics Concern   Not on file  Social History Narrative   Not on file   Social Drivers of Health   Financial Resource Strain: Not on file  Food Insecurity: Not on file  Transportation Needs: Not on file  Physical Activity: Not on file  Stress: Not on file  Social Connections: Not on file  Intimate Partner Violence: Not on file  Current Outpatient Medications:    etonogestrel (NEXPLANON) 68 MG IMPL implant, 1 each by Subdermal route once., Disp: , Rfl:    norethindrone (MICRONOR) 0.35 MG tablet, Take 1 tablet (0.35 mg total) by mouth daily. (Patient not taking: Reported on 11/10/2023), Disp: 84 tablet, Rfl: 0     ROS:  Review of Systems  Constitutional:  Negative for fatigue, fever and unexpected weight change.  Respiratory:  Negative for cough, shortness of breath and wheezing.   Cardiovascular:  Negative for chest pain, palpitations and leg swelling.  Gastrointestinal:  Negative for blood in stool, constipation, diarrhea, nausea and vomiting.  Endocrine: Negative for cold intolerance, heat intolerance and polyuria.  Genitourinary:  Negative for dyspareunia, dysuria,  flank pain, frequency, genital sores, hematuria, menstrual problem, pelvic pain, urgency, vaginal bleeding, vaginal discharge and vaginal pain.  Musculoskeletal:  Negative for back pain, joint swelling and myalgias.  Skin:  Negative for rash.  Neurological:  Negative for dizziness, syncope, light-headedness, numbness and headaches.  Hematological:  Negative for adenopathy.  Psychiatric/Behavioral:  Negative for agitation, confusion, sleep disturbance and suicidal ideas. The patient is not nervous/anxious.    BREAST: No symptoms   Objective: BP 112/70   Ht 5\' 5"  (1.651 m)   Wt 229 lb (103.9 kg)   BMI 38.11 kg/m    Physical Exam Constitutional:      Appearance: She is well-developed.  Genitourinary:     Vulva normal.     Right Labia: No rash, tenderness or lesions.    Left Labia: No tenderness, lesions or rash.    No vaginal discharge, erythema or tenderness.      Right Adnexa: not tender and no mass present.    Left Adnexa: not tender and no mass present.    No cervical friability or polyp.     Uterus is not enlarged or tender.  Breasts:    Right: No mass, nipple discharge, skin change or tenderness.     Left: No mass, nipple discharge, skin change or tenderness.  Neck:     Thyroid: No thyromegaly.  Cardiovascular:     Rate and Rhythm: Normal rate and regular rhythm.     Heart sounds: Normal heart sounds. No murmur heard. Pulmonary:     Effort: Pulmonary effort is normal.     Breath sounds: Normal breath sounds.  Abdominal:     Palpations: Abdomen is soft.     Tenderness: There is no abdominal tenderness. There is no guarding or rebound.  Musculoskeletal:        General: Normal range of motion.     Cervical back: Normal range of motion.  Lymphadenopathy:     Cervical: No cervical adenopathy.  Neurological:     General: No focal deficit present.     Mental Status: She is alert and oriented to person, place, and time.     Cranial Nerves: No cranial nerve deficit.   Skin:    General: Skin is warm and dry.  Psychiatric:        Mood and Affect: Mood normal.        Behavior: Behavior normal.        Thought Content: Thought content normal.        Judgment: Judgment normal.  Vitals reviewed.    Assessment/Plan: Encounter for annual routine gynecological examination  Cervical cancer screening - Plan: Cytology - PAP  Screening for HPV (human papillomavirus) - Plan: Cytology - PAP  Encounter for surveillance of implantable subdermal contraceptive--has 3 yr indication. F/u prn AUB  for camila RF.          GYN counsel adequate intake of calcium and vitamin D, diet and exercise     F/U  Return in about 1 year (around 11/09/2024).  Taariq Leitz B. Nichoals Heyde, PA-C 11/10/2023 4:42 PM

## 2023-11-10 NOTE — Patient Instructions (Signed)
 I value your feedback and you entrusting [place] with your care. If you get a [facility] [place] [facility] patient survey, I would appreciate you taking the time to let us know about your experience today. Thank you! ? ? ?

## 2023-11-12 LAB — CYTOLOGY - PAP
Comment: NEGATIVE
Diagnosis: NEGATIVE
High risk HPV: NEGATIVE

## 2023-12-08 ENCOUNTER — Other Ambulatory Visit: Payer: Self-pay | Admitting: Obstetrics and Gynecology

## 2023-12-08 DIAGNOSIS — Z975 Presence of (intrauterine) contraceptive device: Secondary | ICD-10-CM

## 2023-12-18 ENCOUNTER — Encounter (HOSPITAL_COMMUNITY): Payer: Self-pay

## 2023-12-18 ENCOUNTER — Ambulatory Visit (HOSPITAL_COMMUNITY)
Admission: RE | Admit: 2023-12-18 | Discharge: 2023-12-18 | Disposition: A | Discharge status: Home / Self Care | Source: Ambulatory Visit

## 2023-12-18 VITALS — BP 121/84 | HR 88 | Temp 98.2°F | Resp 16

## 2023-12-18 DIAGNOSIS — L237 Allergic contact dermatitis due to plants, except food: Secondary | ICD-10-CM | POA: Diagnosis not present

## 2023-12-18 MED ORDER — TRIAMCINOLONE ACETONIDE 0.1 % EX CREA
1.0000 | TOPICAL_CREAM | Freq: Two times a day (BID) | CUTANEOUS | 0 refills | Status: DC
Start: 2023-12-18 — End: 2024-06-12

## 2023-12-18 MED ORDER — PREDNISONE 20 MG PO TABS: ORAL_TABLET | ORAL | 0 refills | Status: AC

## 2023-12-18 NOTE — Discharge Instructions (Addendum)
  1. Poison ivy dermatitis (Primary) - triamcinolone  cream (KENALOG ) 0.1 %; Apply 1 Application topically 2 (two) times daily.  Dispense: 30 g; Refill: 0 - predniSONE  (DELTASONE ) 20 MG tablet; Take 3 tablets (60 mg total) by mouth daily for 3 days, THEN 2 tablets (40 mg total) daily for 4 days, THEN 1 tablet (20 mg total) daily for 3 days.  Dispense: 20 tablet; Refill: 0 - Take over-the-counter antihistamine medication for the entire course and the following 5 days after finishing steroids to prevent rebound rash. -Continue to monitor symptoms for any change in severity if there is any escalation of current symptoms or development of new symptoms follow-up in ER for further evaluation and management.

## 2023-12-18 NOTE — ED Provider Notes (Signed)
 [facility]  Note:  This document was prepared using Dragon voice recognition software and may include unintentional dictation errors.  MRN: [id number] DOB: August 07, 1991  Subjective:   Nancy Grimes is a 33 y.o. female presenting for severely pruritic and erythematous rash over entire body x 6 days.  Patient believes that she has been exposed to poison ivy from her children.  Patient is been taking Benadryl  and using calamine lotion with minimal improvement.  Patient reports that she is terribly allergic to poison ivy and has tried everything she can at home with no improvement.  Patient reports that calamine lotion is just causing rash to spread more.  Patient denies any fever, cough, sore throat, body aches, fatigue.  No current facility-administered medications for this encounter.  Current Outpatient Medications:    predniSONE  (DELTASONE ) 20 MG tablet, Take 3 tablets (60 mg total) by mouth daily for 3 days, THEN 2 tablets (40 mg total) daily for 4 days, THEN 1 tablet (20 mg total) daily for 3 days., Disp: 20 tablet, Rfl: 0   triamcinolone  cream (KENALOG ) 0.1 %, Apply 1 Application topically 2 (two) times daily., Disp: 30 g, Rfl: 0   etonogestrel  (NEXPLANON ) 68 MG IMPL implant, 1 each by Subdermal route once., Disp: , Rfl:    norethindrone  (MICRONOR ) 0.35 MG tablet, Take 1 tablet (0.35 mg total) by mouth daily. (Patient not taking: Reported on 11/10/2023), Disp: 84 tablet, Rfl: 0   Allergies  Allergen Reactions   Salicylic Acid Rash    Acne Medications   Penicillin G Swelling    Did it involve swelling of the face/tongue/throat, SOB, or low BP? Unknown Did it involve sudden or severe rash/hives, skin peeling, or any reaction on the inside of your mouth or nose? Unknown Did you need to seek medical attention at a hospital or doctor's office? Unknown When did it last happen?      childhood If all above answers are "NO", may proceed with cephalosporin use.     Past Medical  History:  Diagnosis Date   Anemia    Chronic headaches    Miscarriage    Rh negative state in antepartum period, third trimester    Thyroid  disease      Past Surgical History:  Procedure Laterality Date   ARM SURGERY  2007   RIGHT ELBOW   CESAREAN SECTION N/A 05/01/2019   Procedure: CESAREAN SECTION;  Surgeon: Verlyn Goad, MD;  Location: [facility];  Service: Obstetrics;  Laterality: N/A;   VAGINAL DELIVERY     x3    Family History  Problem Relation Age of Onset   Hypertension Father    Hyperlipidemia Father    ADD / ADHD Father    Diabetes Paternal Grandmother        type2   Thyroid  disease Cousin    Hyperlipidemia Maternal Grandmother    Heart disease Maternal Grandmother    Heart disease Maternal Grandfather    Hyperlipidemia Maternal Grandfather    Stroke Maternal Grandfather    Diabetes Paternal Uncle     Social History   Tobacco Use   Smoking status: Former    Current packs/day: 0.00    Average packs/day: 0.3 packs/day for 0.5 years (0.1 ttl pk-yrs)    Types: Cigarettes    Start date: 02/2012    Quit date: 2014    Years since quitting: 11.3   Smokeless tobacco: Never  Vaping Use   Vaping status: Never Used  Substance Use Topics   Alcohol use:  Not Currently   Drug use: No    ROS Refer to HPI for ROS details.  Objective:   Vitals: BP 121/84 (BP Location: Left Arm)   Pulse 88   Temp 98.2 F (36.8 C) (Oral)   Resp 16   SpO2 98%   Physical Exam Vitals and nursing note reviewed.  Constitutional:      General: She is not in acute distress.    Appearance: She is well-developed. She is not ill-appearing or toxic-appearing.  HENT:     Head: Normocephalic and atraumatic.     Nose: Nose normal.     Mouth/Throat:     Mouth: Mucous membranes are moist.     Pharynx: Oropharynx is clear.  Eyes:     General:        Right eye: No discharge.        Left eye: No discharge.     Extraocular Movements: Extraocular movements intact.      Conjunctiva/sclera: Conjunctivae normal.  Cardiovascular:     Rate and Rhythm: Normal rate.  Pulmonary:     Effort: Pulmonary effort is normal. No respiratory distress.  Musculoskeletal:        General: Normal range of motion.  Skin:    General: Skin is warm and dry.     Findings: Erythema and rash (Multiple erythematous papular/vesicular rash strongly consistent with poison ivy dermatitis noted to entire body, most notably face bilateral arms abdomen and legs.) present. Rash is papular and vesicular.  Neurological:     General: No focal deficit present.     Mental Status: She is alert and oriented to person, place, and time.  Psychiatric:        Mood and Affect: Mood normal.        Behavior: Behavior normal.     Procedures  No results found for this or any previous visit (from the past 24 hours).  No results found.   Assessment and Plan :     Discharge Instructions       1. Poison ivy dermatitis (Primary) - triamcinolone  cream (KENALOG ) 0.1 %; Apply 1 Application topically 2 (two) times daily.  Dispense: 30 g; Refill: 0 - predniSONE  (DELTASONE ) 20 MG tablet; Take 3 tablets (60 mg total) by mouth daily for 3 days, THEN 2 tablets (40 mg total) daily for 4 days, THEN 1 tablet (20 mg total) daily for 3 days.  Dispense: 20 tablet; Refill: 0 - Take over-the-counter antihistamine medication for the entire course and the following 5 days after finishing steroids to prevent rebound rash. -Continue to monitor symptoms for any change in severity if there is any escalation of current symptoms or development of new symptoms follow-up in ER for further evaluation and management.    Cassell Voorhies B Rober Skeels   Mariany Mackintosh, [place], [place] 12/18/23 [phone]

## 2023-12-18 NOTE — ED Triage Notes (Signed)
 Pt states rash all over for the past 6 days.  States she thinks it is poison ivy.   States she has been taking benadryl  at home.

## 2024-06-12 ENCOUNTER — Encounter (HOSPITAL_COMMUNITY): Payer: Self-pay

## 2024-06-12 ENCOUNTER — Ambulatory Visit (HOSPITAL_COMMUNITY)
Admission: RE | Admit: 2024-06-12 | Discharge: 2024-06-12 | Disposition: A | Discharge status: Home / Self Care | Source: Ambulatory Visit | Attending: Emergency Medicine | Admitting: Emergency Medicine

## 2024-06-12 VITALS — BP 127/81 | HR 93 | Temp 98.3°F | Resp 16

## 2024-06-12 DIAGNOSIS — L237 Allergic contact dermatitis due to plants, except food: Secondary | ICD-10-CM

## 2024-06-12 DIAGNOSIS — L249 Irritant contact dermatitis, unspecified cause: Secondary | ICD-10-CM

## 2024-06-12 MED ORDER — METHYLPREDNISOLONE SODIUM SUCC 125 MG IJ SOLR
80.0000 mg | Freq: Once | INTRAMUSCULAR | Status: AC
  Administered 2024-06-12: 80 mg via INTRAMUSCULAR

## 2024-06-12 MED ORDER — PREDNISONE 10 MG PO TABS: ORAL_TABLET | ORAL | 0 refills | Status: AC

## 2024-06-12 MED ORDER — METHYLPREDNISOLONE SODIUM SUCC 125 MG IJ SOLR
INTRAMUSCULAR | Status: AC
  Filled 2024-06-12: qty 2

## 2024-06-12 NOTE — ED Triage Notes (Signed)
 Patient reports that her son had Poison Ivy and she has now got it. Rash is to her face, neck, ears, and upper body x 5-6 days.  Patient has been using Benadryl  cream and taking Benadryl  po with no relief.

## 2024-06-12 NOTE — Discharge Instructions (Addendum)
 During your visit today, you received an injection of Solu-Medrol IM (methylprednisolone).  This will significantly resolve the itching and inflammation in your skin caused by exposure to poison ivy.  Starting Tomorrow morning,  I would also like you to continue an oral dose of steroids, Prednisone  and take them as prescribed.  Please be advised that the highest dose, 4 tablets daily, does not need to be taken for a full 5 days.  Please only take the highest dose only until you feel that the rash is turning the corner.  For example: The current itching and swelling that you have now is getting better and there are no more new red, itchy spots appearing.  Please do be sure that you do take the full 5 days of each decreasing dose. The duration of the 10 day decreasing taper is important to make sure that the rash does not reoccur.  You can also use topical medications such as calamine lotion and take regular Oatmeal baths if you find them helpful.  Please follow directions on the label of the product that you purchase.    Theres a good chance that re-exposure to poison ivy, oak or sumac may occur at some point in the next several months and you clearly have an allergy.  For this reason, it is also recommended that you begin an second generation antihistamine.     I have provided you with a prescription for a second-generation antihistamine called Zyrtec (cetirizine). Zyrtec (cetirizine) does not have the same overwhelming effects of drowsiness and dry mouth that Benadryl  (diphenhydramine ) can and works best when taken at bedtime.  Please let us  know if your symptoms have not significantly improved in the next 3 to 5 days.  Thank you for visiting urgent care today and trusting us  with your care.

## 2024-06-12 NOTE — ED Provider Notes (Signed)
 [facility]    CSN: [id number] Arrival date & time: 06/12/24  1659    HISTORY   Chief Complaint  Patient presents with   Poison Ivy    I have poison ivy on my face, hands, chest and arms. I would like to get a steroid shot to get rid of it. - Entered by patient   HPI Nancy Grimes is a pleasant, 33 y.o. female who presents to urgent care today. Patient states her son got into poison ivy and has been touching her face and neck, now has rash on her face, neck and ears as well as parts of her upper body, has been dealing with this for the past 5 to 6 days, has been using topical Benadryl  cream with no relief.  Patient reports being very allergic to poison ivy.  The history is provided by the patient.  Poison Ivy   Past Medical History:  Diagnosis Date   Anemia    Chronic headaches    Miscarriage    Rh negative state in antepartum period, third trimester    Thyroid  disease    Patient Active Problem List   Diagnosis Date Noted   Painless (silent) thyroiditis 10/24/2019   Weight gain 10/24/2019   Allergic contact dermatitis due to metals 02/17/2019   Missed menses 02/17/2019   Common migraine 06/16/2018   Obesity (BMI 30.0-34.9) 06/16/2018   Past Surgical History:  Procedure Laterality Date   ARM SURGERY  2007   RIGHT ELBOW   CESAREAN SECTION N/A 05/01/2019   Procedure: CESAREAN SECTION;  Surgeon: Alger Gong, MD;  Location: [facility];  Service: Obstetrics;  Laterality: N/A;   VAGINAL DELIVERY     x3   OB History     Gravida  5   Para  4   Term  4   Preterm      AB  1   Living  4      SAB  1   IAB      Ectopic      Multiple  0   Live Births  4          Home Medications    Prior to Admission medications   Medication Sig Start Date End Date Taking? Authorizing Provider  etonogestrel  (NEXPLANON ) 68 MG IMPL implant 1 each by Subdermal route once. 10/30/22   [provider]  norethindrone  (MICRONOR ) 0.35 MG tablet Take  1 tablet (0.35 mg total) by mouth daily. Patient not taking: Reported on 11/10/2023 09/17/23   Copland, Alicia B, PA-C  triamcinolone  cream (KENALOG ) 0.1 % Apply 1 Application topically 2 (two) times daily. Patient not taking: Reported on 06/12/2024 12/18/23   Aurea Ethel NOVAK, NP    Family History Family History  Problem Relation Age of Onset   Hypertension Father    Hyperlipidemia Father    ADD / ADHD Father    Diabetes Paternal Grandmother        type2   Thyroid  disease Cousin    Hyperlipidemia Maternal Grandmother    Heart disease Maternal Grandmother    Heart disease Maternal Grandfather    Hyperlipidemia Maternal Grandfather    Stroke Maternal Grandfather    Diabetes Paternal Uncle    Social History Social History   Tobacco Use   Smoking status: Former    Current packs/day: 0.00    Average packs/day: 0.3 packs/day for 0.5 years (0.1 ttl pk-yrs)    Types: Cigarettes    Start date: 02/2012  Quit date: 2014    Years since quitting: 11.8   Smokeless tobacco: Never  Vaping Use   Vaping status: Never Used  Substance Use Topics   Alcohol use: Not Currently   Drug use: No   Allergies   Salicylic acid and Penicillin g  Review of Systems Review of Systems Pertinent findings revealed after performing a 14 point review of systems has been noted in the history of present illness.  Physical Exam Vital Signs BP 127/81 (BP Location: Left Arm)   Pulse 93   Temp 98.3 F (36.8 C) (Oral)   Resp 16   SpO2 94%   No data found.  Physical Exam Vitals and nursing note reviewed.  Constitutional:      General: She is awake. She is not in acute distress.    Appearance: Normal appearance. She is well-developed and well-groomed. She is not ill-appearing.  HENT:     Head:     Comments: Multiple areas of atopic dermatitis with small, clear fluid-filled blisters present on forehead, right cheek, angle of right jaw, both sides of neck and around collarbone appreciated without  signs of excoriation or superficial infection Neurological:     Mental Status: She is alert.  Psychiatric:        Behavior: Behavior is cooperative.     Visual Acuity Right Eye Distance:   Left Eye Distance:   Bilateral Distance:    Right Eye Near:   Left Eye Near:    Bilateral Near:     [facility] Couse / Diagnostics / Procedures:     Radiology No results found.  Procedures Procedures (including critical care time) EKG  Pending results:  Labs Reviewed - No data to display  Medications Ordered in UC: Medications  methylPREDNISolone sodium succinate (SOLU-MEDROL) 125 mg/2 mL injection 80 mg (80 mg Intramuscular Given 06/12/24 1820)    UC Diagnoses / Final Clinical Impressions(s)   I have reviewed the triage vital signs and the nursing notes.  Pertinent labs & imaging results that were available during my care of the patient were reviewed by me and considered in my medical decision making (see chart for details).    Final diagnoses:  Poison ivy dermatitis  Irritant contact dermatitis of face   Patient provided with an injection of Solu-Medrol during her visit today and advised to begin a tapering dose of prednisone .  Okay to continue using Benadryl  cream.  Patient advised to monitor for signs of infection and follow-up closely if needed.  Please see discharge instructions below for details of plan of care as provided to patient. ED Prescriptions     Medication Sig Dispense Auth. Provider   predniSONE  (DELTASONE ) 10 MG tablet Take 4 tablets (40 mg total) by mouth daily for 5 days, THEN 2 tablets (20 mg total) daily for 5 days, THEN 1 tablet (10 mg total) daily for 5 days. 35 tablet Joesph Shaver Scales, PA-C      PDMP not reviewed this encounter.  Pending results:  Labs Reviewed - No data to display    Discharge Instructions      During your visit today, you received an injection of Solu-Medrol IM (methylprednisolone).  This will significantly resolve the itching  and inflammation in your skin caused by exposure to poison ivy.  Starting Tomorrow morning,  I would also like you to continue an oral dose of steroids, Prednisone  and take them as prescribed.  Please be advised that the highest dose, 4 tablets daily, does not need to be taken  for a full 5 days.  Please only take the highest dose only until you feel that the rash is turning the corner.  For example: The current itching and swelling that you have now is getting better and there are no more new red, itchy spots appearing.  Please do be sure that you do take the full 5 days of each decreasing dose. The duration of the 10 day decreasing taper is important to make sure that the rash does not reoccur.  You can also use topical medications such as calamine lotion and take regular Oatmeal baths if you find them helpful.  Please follow directions on the label of the product that you purchase.    Theres a good chance that re-exposure to poison ivy, oak or sumac may occur at some point in the next several months and you clearly have an allergy.  For this reason, it is also recommended that you begin an second generation antihistamine.     I have provided you with a prescription for a second-generation antihistamine called Zyrtec (cetirizine). Zyrtec (cetirizine) does not have the same overwhelming effects of drowsiness and dry mouth that Benadryl  (diphenhydramine ) can and works best when taken at bedtime.  Please let us  know if your symptoms have not significantly improved in the next 3 to 5 days.  Thank you for visiting urgent care today and trusting us  with your care.        Disposition Upon Discharge:  Condition: stable for discharge home  Patient presented with an acute illness with associated systemic symptoms and significant discomfort requiring urgent management. In my opinion, this is a condition that a prudent lay person (someone who possesses an average knowledge of health and medicine) may  potentially expect to result in complications if not addressed urgently such as respiratory distress, impairment of bodily function or dysfunction of bodily organs.   Routine symptom specific, illness specific and/or disease specific instructions were discussed with the patient and/or caregiver at length.   As such, the patient has been evaluated and assessed, work-up was performed and treatment was provided in alignment with urgent care protocols and evidence based medicine.  Patient/parent/caregiver has been advised that the patient may require follow up for further testing and treatment if the symptoms continue in spite of treatment, as clinically indicated and appropriate.  Patient/parent/caregiver has been advised to return to the [facility] or PCP if no better; to PCP or the Emergency Department if new signs and symptoms develop, or if the current signs or symptoms continue to change or worsen for further workup, evaluation and treatment as clinically indicated and appropriate  The patient will follow up with their current PCP if and as advised. If the patient does not currently have a PCP we will assist them in obtaining one.   The patient may need specialty follow up if the symptoms continue, in spite of conservative treatment and management, for further workup, evaluation, consultation and treatment as clinically indicated and appropriate.  Patient/parent/caregiver verbalized understanding and agreement of plan as discussed.  All questions were addressed during visit.  Please see discharge instructions below for further details of plan.  This office note has been dictated using Teaching laboratory technician.  Unfortunately, this method of dictation can sometimes lead to typographical or grammatical errors.  I apologize for your inconvenience in advance if this occurs.  Please do not hesitate to reach out to me if clarification is needed.      Joesph Shaver [place], [place] 06/13/24 [phone]
